# Patient Record
Sex: Male | Born: 1945
Health system: Southern US, Community
[De-identification: ages and names within clinical notes are randomized; demographics above are authoritative.]

## PROBLEM LIST (undated history)

## (undated) DIAGNOSIS — D649 Anemia, unspecified: Secondary | ICD-10-CM

## (undated) DIAGNOSIS — E785 Hyperlipidemia, unspecified: Secondary | ICD-10-CM

## (undated) DIAGNOSIS — N400 Enlarged prostate without lower urinary tract symptoms: Secondary | ICD-10-CM

## (undated) DIAGNOSIS — N529 Male erectile dysfunction, unspecified: Secondary | ICD-10-CM

## (undated) DIAGNOSIS — C61 Malignant neoplasm of prostate: Secondary | ICD-10-CM

## (undated) DIAGNOSIS — D693 Immune thrombocytopenic purpura: Secondary | ICD-10-CM

## (undated) DIAGNOSIS — I10 Essential (primary) hypertension: Secondary | ICD-10-CM

## (undated) DIAGNOSIS — N289 Disorder of kidney and ureter, unspecified: Secondary | ICD-10-CM

## (undated) DIAGNOSIS — N183 Chronic kidney disease, stage 3 unspecified: Secondary | ICD-10-CM

## (undated) DIAGNOSIS — M1A00X0 Idiopathic chronic gout, unspecified site, without tophus (tophi): Secondary | ICD-10-CM

## (undated) DIAGNOSIS — Z87438 Personal history of other diseases of male genital organs: Secondary | ICD-10-CM

## (undated) DIAGNOSIS — Z973 Presence of spectacles and contact lenses: Secondary | ICD-10-CM

## (undated) DIAGNOSIS — R739 Hyperglycemia, unspecified: Secondary | ICD-10-CM

## (undated) DIAGNOSIS — R339 Retention of urine, unspecified: Secondary | ICD-10-CM

## (undated) DIAGNOSIS — Z789 Other specified health status: Secondary | ICD-10-CM

## (undated) HISTORY — PX: PROSTATE BIOPSY: SHX241

## (undated) HISTORY — DX: Hyperlipidemia, unspecified: E78.5

## (undated) HISTORY — DX: Essential (primary) hypertension: I10

## (undated) HISTORY — PX: TONSILLECTOMY: SUR1361

## (undated) HISTORY — DX: Hyperglycemia, unspecified: R73.9

---

## 1997-08-26 ENCOUNTER — Emergency Department (HOSPITAL_COMMUNITY): Admission: EM | Admit: 1997-08-26 | Discharge: 1997-08-26 | Payer: Self-pay | Admitting: Emergency Medicine

## 2003-02-16 HISTORY — PX: COLONOSCOPY: SHX174

## 2013-12-03 ENCOUNTER — Ambulatory Visit (INDEPENDENT_AMBULATORY_CARE_PROVIDER_SITE_OTHER): Payer: Self-pay | Admitting: Internal Medicine

## 2013-12-03 VITALS — BP 132/78 | HR 86 | Temp 97.5°F | Resp 16 | Ht 63.5 in | Wt 157.0 lb

## 2013-12-03 DIAGNOSIS — Z0289 Encounter for other administrative examinations: Secondary | ICD-10-CM

## 2013-12-03 NOTE — Progress Notes (Signed)
   Subjective:    Patient ID: Steven Bell, male    DOB: 1946-02-10, 68 y.o.   MRN: [id number]  HPI Mr. 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 He states that he does not have health topics to discuss today, just here for a physical so that he can return to work today.  Taking BP meds, has BPH under eval by family doc.   Review of Systems  Constitutional: Negative.   HENT: Negative.   Eyes: Negative.   Respiratory: Negative.   Cardiovascular: Negative.   Gastrointestinal: Negative.   Endocrine: Negative.   Genitourinary: Positive for hematuria.  Musculoskeletal: Negative.   Skin: Negative.   Allergic/Immunologic: Negative.   Neurological: Negative.   Hematological: Negative.   Psychiatric/Behavioral: Negative.        Objective:   Physical Exam  Vitals reviewed. Constitutional: He is oriented to person, place, and time. He appears well-developed and well-nourished. No distress.  HENT:  Head: Normocephalic.  Right Ear: External ear normal.  Left Ear: External ear normal.  Nose: Nose normal.  Mouth/Throat: Oropharynx is clear and moist.  Eyes: Conjunctivae and EOM are normal. Pupils are equal, round, and reactive to light.  Neck: Normal range of motion. Neck supple. No tracheal deviation present. No thyromegaly present.  Cardiovascular: Normal rate, regular rhythm and intact distal pulses.   No murmur heard. Pulmonary/Chest: Effort normal and breath sounds normal.  Abdominal: Soft. Bowel sounds are normal. He exhibits no mass. There is no tenderness. A hernia is present. Hernia confirmed positive in the right inguinal area. Hernia confirmed negative in the left inguinal area.  Genitourinary: Testes normal and penis normal. Circumcised.  Musculoskeletal: Normal range of motion. He exhibits no edema and no tenderness.  Lymphadenopathy:    He has no cervical adenopathy.       Right: No inguinal adenopathy present.       Left: No inguinal adenopathy  present.  Neurological: He is alert and oriented to person, place, and time. He has normal reflexes. No cranial nerve deficit. He exhibits normal muscle tone. Coordination normal.  Psychiatric: He has a normal mood and affect. Judgment and thought content normal.          Assessment & Plan:  DOT 1year card/HTN controlled

## 2013-12-03 NOTE — Patient Instructions (Signed)

## 2013-12-26 ENCOUNTER — Encounter: Payer: Self-pay | Admitting: Internal Medicine

## 2014-01-25 DIAGNOSIS — E663 Overweight: Secondary | ICD-10-CM | POA: Diagnosis not present

## 2014-01-25 DIAGNOSIS — R972 Elevated prostate specific antigen [PSA]: Secondary | ICD-10-CM | POA: Diagnosis not present

## 2014-01-25 DIAGNOSIS — I10 Essential (primary) hypertension: Secondary | ICD-10-CM | POA: Diagnosis not present

## 2014-01-25 DIAGNOSIS — N400 Enlarged prostate without lower urinary tract symptoms: Secondary | ICD-10-CM | POA: Diagnosis not present

## 2014-01-25 DIAGNOSIS — R7301 Impaired fasting glucose: Secondary | ICD-10-CM | POA: Diagnosis not present

## 2014-01-25 DIAGNOSIS — M549 Dorsalgia, unspecified: Secondary | ICD-10-CM | POA: Diagnosis not present

## 2014-01-25 DIAGNOSIS — N183 Chronic kidney disease, stage 3 (moderate): Secondary | ICD-10-CM | POA: Diagnosis not present

## 2014-01-25 DIAGNOSIS — E785 Hyperlipidemia, unspecified: Secondary | ICD-10-CM | POA: Diagnosis not present

## 2014-02-04 DIAGNOSIS — N1330 Unspecified hydronephrosis: Secondary | ICD-10-CM | POA: Diagnosis not present

## 2014-02-04 DIAGNOSIS — I10 Essential (primary) hypertension: Secondary | ICD-10-CM | POA: Diagnosis not present

## 2014-02-07 ENCOUNTER — Encounter (HOSPITAL_COMMUNITY): Payer: Self-pay | Admitting: Emergency Medicine

## 2014-02-07 ENCOUNTER — Inpatient Hospital Stay (HOSPITAL_COMMUNITY)
Admission: EM | Admit: 2014-02-07 | Discharge: 2014-02-12 | DRG: 813 | Disposition: A | Discharge status: Home / Self Care | Payer: PRIVATE HEALTH INSURANCE | Attending: Internal Medicine | Admitting: Internal Medicine

## 2014-02-07 DIAGNOSIS — N289 Disorder of kidney and ureter, unspecified: Secondary | ICD-10-CM | POA: Diagnosis not present

## 2014-02-07 DIAGNOSIS — A419 Sepsis, unspecified organism: Secondary | ICD-10-CM | POA: Diagnosis not present

## 2014-02-07 DIAGNOSIS — R233 Spontaneous ecchymoses: Secondary | ICD-10-CM | POA: Diagnosis present

## 2014-02-07 DIAGNOSIS — R6510 Systemic inflammatory response syndrome (SIRS) of non-infectious origin without acute organ dysfunction: Secondary | ICD-10-CM | POA: Diagnosis not present

## 2014-02-07 DIAGNOSIS — M25531 Pain in right wrist: Secondary | ICD-10-CM | POA: Diagnosis not present

## 2014-02-07 DIAGNOSIS — I129 Hypertensive chronic kidney disease with stage 1 through stage 4 chronic kidney disease, or unspecified chronic kidney disease: Secondary | ICD-10-CM | POA: Diagnosis present

## 2014-02-07 DIAGNOSIS — N1330 Unspecified hydronephrosis: Secondary | ICD-10-CM | POA: Diagnosis present

## 2014-02-07 DIAGNOSIS — E785 Hyperlipidemia, unspecified: Secondary | ICD-10-CM | POA: Diagnosis present

## 2014-02-07 DIAGNOSIS — R319 Hematuria, unspecified: Secondary | ICD-10-CM | POA: Diagnosis present

## 2014-02-07 DIAGNOSIS — M25419 Effusion, unspecified shoulder: Secondary | ICD-10-CM | POA: Diagnosis not present

## 2014-02-07 DIAGNOSIS — N183 Chronic kidney disease, stage 3 (moderate): Secondary | ICD-10-CM | POA: Diagnosis present

## 2014-02-07 DIAGNOSIS — E871 Hypo-osmolality and hyponatremia: Secondary | ICD-10-CM | POA: Diagnosis present

## 2014-02-07 DIAGNOSIS — I10 Essential (primary) hypertension: Secondary | ICD-10-CM | POA: Diagnosis not present

## 2014-02-07 DIAGNOSIS — D693 Immune thrombocytopenic purpura: Principal | ICD-10-CM

## 2014-02-07 DIAGNOSIS — R238 Other skin changes: Secondary | ICD-10-CM | POA: Diagnosis not present

## 2014-02-07 DIAGNOSIS — D72829 Elevated white blood cell count, unspecified: Secondary | ICD-10-CM

## 2014-02-07 DIAGNOSIS — N400 Enlarged prostate without lower urinary tract symptoms: Secondary | ICD-10-CM | POA: Diagnosis present

## 2014-02-07 DIAGNOSIS — Z87440 Personal history of urinary (tract) infections: Secondary | ICD-10-CM

## 2014-02-07 DIAGNOSIS — D696 Thrombocytopenia, unspecified: Secondary | ICD-10-CM

## 2014-02-07 DIAGNOSIS — D649 Anemia, unspecified: Secondary | ICD-10-CM | POA: Diagnosis present

## 2014-02-07 DIAGNOSIS — M25431 Effusion, right wrist: Secondary | ICD-10-CM | POA: Diagnosis not present

## 2014-02-07 DIAGNOSIS — R6511 Systemic inflammatory response syndrome (SIRS) of non-infectious origin with acute organ dysfunction: Secondary | ICD-10-CM | POA: Diagnosis present

## 2014-02-07 DIAGNOSIS — R972 Elevated prostate specific antigen [PSA]: Secondary | ICD-10-CM | POA: Diagnosis not present

## 2014-02-07 DIAGNOSIS — N179 Acute kidney failure, unspecified: Secondary | ICD-10-CM | POA: Diagnosis not present

## 2014-02-07 DIAGNOSIS — M1990 Unspecified osteoarthritis, unspecified site: Secondary | ICD-10-CM | POA: Diagnosis not present

## 2014-02-07 DIAGNOSIS — R509 Fever, unspecified: Secondary | ICD-10-CM

## 2014-02-07 DIAGNOSIS — Z79899 Other long term (current) drug therapy: Secondary | ICD-10-CM | POA: Diagnosis not present

## 2014-02-07 DIAGNOSIS — M25511 Pain in right shoulder: Secondary | ICD-10-CM | POA: Diagnosis not present

## 2014-02-07 DIAGNOSIS — D631 Anemia in chronic kidney disease: Secondary | ICD-10-CM | POA: Diagnosis not present

## 2014-02-07 DIAGNOSIS — N3941 Urge incontinence: Secondary | ICD-10-CM | POA: Diagnosis present

## 2014-02-07 DIAGNOSIS — M25439 Effusion, unspecified wrist: Secondary | ICD-10-CM | POA: Diagnosis not present

## 2014-02-07 HISTORY — DX: Disorder of kidney and ureter, unspecified: N28.9

## 2014-02-07 HISTORY — DX: Benign prostatic hyperplasia without lower urinary tract symptoms: N40.0

## 2014-02-07 LAB — CBC WITH DIFFERENTIAL/PLATELET
Basophils Absolute: 0 10*3/uL (ref 0.0–0.1)
Basophils Relative: 0 % (ref 0–1)
Eosinophils Absolute: 0 10*3/uL (ref 0.0–0.7)
Eosinophils Relative: 0 % (ref 0–5)
HCT: 33.5 % — ABNORMAL LOW (ref 39.0–52.0)
Hemoglobin: 10.7 g/dL — ABNORMAL LOW (ref 13.0–17.0)
Lymphocytes Relative: 17 % (ref 12–46)
Lymphs Abs: 2.8 10*3/uL (ref 0.7–4.0)
MCH: 29.5 pg (ref 26.0–34.0)
MCHC: 31.9 g/dL (ref 30.0–36.0)
MCV: 92.3 fL (ref 78.0–100.0)
Monocytes Absolute: 1.2 10*3/uL — ABNORMAL HIGH (ref 0.1–1.0)
Monocytes Relative: 7 % (ref 3–12)
Neutro Abs: 12.9 10*3/uL — ABNORMAL HIGH (ref 1.7–7.7)
Neutrophils Relative %: 76 % (ref 43–77)
Platelets: <5 10*3/uL — CL (ref 150–400)
RBC: 3.63 MIL/uL — ABNORMAL LOW (ref 4.22–5.81)
RDW: 12.9 % (ref 11.5–15.5)
WBC: 16.9 10*3/uL — ABNORMAL HIGH (ref 4.0–10.5)

## 2014-02-07 LAB — COMPREHENSIVE METABOLIC PANEL
ALT: 14 U/L (ref 0–53)
AST: 23 U/L (ref 0–37)
Albumin: 4 g/dL (ref 3.5–5.2)
Alkaline Phosphatase: 62 U/L (ref 39–117)
Anion gap: 12 (ref 5–15)
BUN: 29 mg/dL — ABNORMAL HIGH (ref 6–23)
CO2: 22 mmol/L (ref 19–32)
Calcium: 9.6 mg/dL (ref 8.4–10.5)
Chloride: 105 mEq/L (ref 96–112)
Creatinine, Ser: 1.69 mg/dL — ABNORMAL HIGH (ref 0.50–1.35)
GFR calc Af Amer: 46 mL/min — ABNORMAL LOW (ref >90)
GFR calc non Af Amer: 40 mL/min — ABNORMAL LOW (ref >90)
Glucose, Bld: 115 mg/dL — ABNORMAL HIGH (ref 70–99)
Potassium: 4.2 mmol/L (ref 3.5–5.1)
Sodium: 139 mmol/L (ref 135–145)
Total Bilirubin: 0.9 mg/dL (ref 0.3–1.2)
Total Protein: 9 g/dL — ABNORMAL HIGH (ref 6.0–8.3)

## 2014-02-07 LAB — PROTIME-INR
INR: 1.04 (ref 0.00–1.49)
Prothrombin Time: 13.7 seconds (ref 11.6–15.2)

## 2014-02-07 LAB — RETICULOCYTES
RBC.: 3.63 MIL/uL — ABNORMAL LOW (ref 4.22–5.81)
Retic Count, Absolute: 32.7 10*3/uL (ref 19.0–186.0)
Retic Ct Pct: 0.9 % (ref 0.4–3.1)

## 2014-02-07 LAB — RAPID HIV SCREEN (WH-MAU): Rapid HIV Screen: NONREACTIVE

## 2014-02-07 LAB — TYPE AND SCREEN
ABO/RH(D): O POS
Antibody Screen: NEGATIVE

## 2014-02-07 LAB — SAVE SMEAR

## 2014-02-07 LAB — APTT: aPTT: 29 seconds (ref 24–37)

## 2014-02-07 LAB — ABO/RH: ABO/RH(D): O POS

## 2014-02-07 MED ORDER — PIPERACILLIN-TAZOBACTAM 3.375 G IVPB 30 MIN
3.3750 g | Freq: Once | INTRAVENOUS | Status: AC
  Administered 2014-02-07: 3.375 g via INTRAVENOUS
  Filled 2014-02-07: qty 50

## 2014-02-07 MED ORDER — ACETAMINOPHEN 325 MG PO TABS
650.0000 mg | ORAL_TABLET | Freq: Once | ORAL | Status: AC
Start: 2014-02-07 — End: 2014-02-07
  Administered 2014-02-07: 650 mg via ORAL
  Filled 2014-02-07: qty 2

## 2014-02-07 MED ORDER — LISINOPRIL 20 MG PO TABS
20.0000 mg | ORAL_TABLET | Freq: Every day | ORAL | Status: DC
  Administered 2014-02-08 – 2014-02-09 (×2): 20 mg via ORAL
  Filled 2014-02-07 (×2): qty 1

## 2014-02-07 MED ORDER — METHYLPREDNISOLONE SODIUM SUCC 125 MG IJ SOLR
70.0000 mg | Freq: Two times a day (BID) | INTRAMUSCULAR | Status: DC
  Administered 2014-02-08 – 2014-02-09 (×4): 70 mg via INTRAVENOUS
  Filled 2014-02-07 (×6): qty 1.12

## 2014-02-07 MED ORDER — MORPHINE SULFATE 4 MG/ML IJ SOLN
6.0000 mg | Freq: Once | INTRAMUSCULAR | Status: AC
  Administered 2014-02-07: 6 mg via INTRAVENOUS
  Filled 2014-02-07: qty 2

## 2014-02-07 MED ORDER — ADULT MULTIVITAMIN W/MINERALS CH
1.0000 | ORAL_TABLET | Freq: Every day | ORAL | Status: DC
  Administered 2014-02-08 – 2014-02-12 (×5): 1 via ORAL
  Filled 2014-02-07 (×6): qty 1

## 2014-02-07 MED ORDER — ACETAMINOPHEN 500 MG PO TABS
500.0000 mg | ORAL_TABLET | Freq: Four times a day (QID) | ORAL | Status: DC | PRN
  Filled 2014-02-07: qty 2

## 2014-02-07 MED ORDER — METHYLPREDNISOLONE SODIUM SUCC 125 MG IJ SOLR
1.0000 mg/kg | Freq: Once | INTRAMUSCULAR | Status: AC
  Administered 2014-02-07: 71.25 mg via INTRAVENOUS
  Filled 2014-02-07: qty 1.14

## 2014-02-07 MED ORDER — ATORVASTATIN CALCIUM 10 MG PO TABS
10.0000 mg | ORAL_TABLET | Freq: Every day | ORAL | Status: DC
  Administered 2014-02-08 – 2014-02-12 (×5): 10 mg via ORAL
  Filled 2014-02-07 (×5): qty 1

## 2014-02-07 MED ORDER — IMMUNE GLOBULIN (HUMAN) 10 GM/100ML IV SOLN
1.0000 g/kg | INTRAVENOUS | Status: DC
  Administered 2014-02-08: 70 g via INTRAVENOUS
  Filled 2014-02-07 (×2): qty 700

## 2014-02-07 MED ORDER — SODIUM CHLORIDE 0.9 % IJ SOLN
3.0000 mL | Freq: Two times a day (BID) | INTRAMUSCULAR | Status: DC
  Administered 2014-02-07 – 2014-02-11 (×7): 3 mL via INTRAVENOUS

## 2014-02-07 MED ORDER — VANCOMYCIN HCL IN DEXTROSE 750-5 MG/150ML-% IV SOLN
750.0000 mg | Freq: Two times a day (BID) | INTRAVENOUS | Status: DC
  Administered 2014-02-07: 750 mg via INTRAVENOUS
  Filled 2014-02-07 (×2): qty 150

## 2014-02-07 MED ORDER — METOPROLOL SUCCINATE ER 100 MG PO TB24
100.0000 mg | ORAL_TABLET | Freq: Every day | ORAL | Status: DC
  Administered 2014-02-08 – 2014-02-12 (×5): 100 mg via ORAL
  Filled 2014-02-07 (×5): qty 1

## 2014-02-07 MED ORDER — SODIUM CHLORIDE 0.9 % IV SOLN
10.0000 mL/h | Freq: Once | INTRAVENOUS | Status: AC
Start: 2014-02-07 — End: 2014-02-07
  Administered 2014-02-07: 10 mL/h via INTRAVENOUS

## 2014-02-07 MED ORDER — TAMSULOSIN HCL 0.4 MG PO CAPS
0.4000 mg | ORAL_CAPSULE | Freq: Every day | ORAL | Status: DC
  Administered 2014-02-07 – 2014-02-11 (×5): 0.4 mg via ORAL
  Filled 2014-02-07 (×6): qty 1

## 2014-02-07 MED ORDER — METHYLPREDNISOLONE SODIUM SUCC 125 MG IJ SOLR: 1.0000 mg/kg | Freq: Two times a day (BID) | INTRAMUSCULAR | Status: DC

## 2014-02-07 MED ORDER — VANCOMYCIN HCL IN DEXTROSE 1-5 GM/200ML-% IV SOLN
1000.0000 mg | Freq: Once | INTRAVENOUS | Status: DC
  Filled 2014-02-07: qty 200

## 2014-02-07 MED ORDER — PIPERACILLIN-TAZOBACTAM 3.375 G IVPB
3.3750 g | Freq: Three times a day (TID) | INTRAVENOUS | Status: DC
  Administered 2014-02-08 – 2014-02-12 (×13): 3.375 g via INTRAVENOUS
  Filled 2014-02-07 (×15): qty 50

## 2014-02-07 MED ORDER — HYDROCHLOROTHIAZIDE 25 MG PO TABS
25.0000 mg | ORAL_TABLET | Freq: Every day | ORAL | Status: DC
  Administered 2014-02-08 – 2014-02-09 (×2): 25 mg via ORAL
  Filled 2014-02-07 (×2): qty 1

## 2014-02-07 NOTE — Progress Notes (Signed)
New Hematology/Oncology Consult   Referral MD: Dr. Alcario Drought        Reason for Referral: Severe thrombocytopenia     HPI: He reports a 2 day history of right wrist and right shoulder pain. The pain was not relieved with Tylenol and ibuprofen. He presented to [facility] urgent care (we do not have the note or laboratory studies available tonight) and was found to have severe thrombocytopenia. He was referred to the emergency room for further evaluation.  He noted bruising in the mouth beginning yesterday. No fever, recent infection, or new medications. He otherwise feels well. No pain in other sites.  He reports he is being evaluated by his primary physician for an elevated PSA and abnormal renal function.    Past Medical History  Diagnosis Date  . Hypertension   . Hyperlipidemia   . BPH (benign prostatic hyperplasia)   .    Past surgical history: None    family history: No family history of a hematologic condition or cancer   Social history: He is a Administrator. He does not smoke. No heavy alcohol use. No transfusion history. No risk factor for HIV or hepatitis.   Current facility-administered medications: acetaminophen (TYLENOL) tablet 500-1,000 mg, 500-1,000 mg, Oral, Q6H PRN, Etta Quill, DO;  [START ON 02/08/2014] atorvastatin (LIPITOR) tablet 10 mg, 10 mg, Oral, Daily, Jared M Gardner, DO;  [START ON 02/08/2014] hydrochlorothiazide (HYDRODIURIL) tablet 25 mg, 25 mg, Oral, Daily, Jared M Gardner, DO [START ON 02/08/2014] lisinopril (PRINIVIL,ZESTRIL) tablet 20 mg, 20 mg, Oral, Daily, Jared M Gardner, DO;  [START ON 02/08/2014] metoprolol succinate (TOPROL-XL) 24 hr tablet 100 mg, 100 mg, Oral, Daily, Jared M Gardner, DO;  multivitamin with minerals tablet 1 tablet, 1 tablet, Oral, Daily, Jared M Gardner, DO;  [START ON 02/08/2014] piperacillin-tazobactam (ZOSYN) IVPB 3.375 g, 3.375 g, Intravenous, Q8H, Angela Adam, RPH sodium chloride 0.9 % injection 3 mL, 3 mL, Intravenous,  Q12H, Jared M Gardner, DO;  tamsulosin (FLOMAX) capsule 0.4 mg, 0.4 mg, Oral, QHS, Jared M Gardner, DO;  vancomycin (VANCOCIN) IVPB 750 mg/150 ml premix, 750 mg, Intravenous, Q12H, Angela Adam, [facility] Current outpatient prescriptions: acetaminophen (TYLENOL) 500 MG tablet, Take 500-1,000 mg by mouth every 6 (six) hours as needed for moderate pain., Disp: , Rfl: ;  atorvastatin (LIPITOR) 10 MG tablet, Take 10 mg by mouth daily., Disp: , Rfl: ;  hydrochlorothiazide (HYDRODIURIL) 25 MG tablet, Take 25 mg by mouth daily., Disp: , Rfl: ;  lisinopril (PRINIVIL,ZESTRIL) 20 MG tablet, Take 20 mg by mouth daily., Disp: , Rfl:  metoprolol succinate (TOPROL-XL) 100 MG 24 hr tablet, Take 100 mg by mouth daily. Take with or immediately following a meal., Disp: , Rfl: ;  Multiple Vitamin (MULTIVITAMIN WITH MINERALS) TABS tablet, Take 1 tablet by mouth daily., Disp: , Rfl: ;  tamsulosin (FLOMAX) 0.4 MG CAPS capsule, Take 0.4 mg by mouth at bedtime., Disp: , Rfl: ;  aspirin EC 81 MG tablet, Take 81 mg by mouth at bedtime., Disp: , Rfl: :   Review of Systems:  Positives include: Pain and swelling at the right wrist and right shoulder for the past 2 days, bruises at the uncle mucosa for the past 1 day, he reports several episodes of gross hematuria-none in the past week, polyuria/nocturia  A complete ROS was otherwise negative.   Physical Exam:  Blood pressure 151/66, pulse 90, temperature 101.1 F (38.4 C), temperature source Rectal, resp. rate 26, SpO2 95 %.  HEENT: Ecchymoses at the  buccal mucosa bilaterally, no thrush, oral cavity without visible mass Lungs: Clear bilaterally Cardiac: Regular rate and rhythm Abdomen: Soft and nontender, no hepatosplenomegaly GU: Testes without mass  Vascular: No leg edema Lymph nodes: No cervical, supraclavicular, or inguinal nodes. "Shotty "bilateral axillary nodes Neurologic: Alert and oriented, the motor exam appears grossly intact Skin: Petechial rash over the  legs Musculoskeletal: Soft tissue swelling superior and posterior to the right shoulder joint with an ecchymosis posteriorly, soft tissue swelling at the right wrist and distal forearm. No pain with motion at the wrist or right shoulder.  LABS:   Recent Labs  02/07/14 1834  WBC 16.9*  HGB 10.7*  HCT 33.5*  PLT <5*   Peripheral blood smear: Rare large platelet form noted on extensive review of the blood smear. No platelet clumps. The majority of the white cells are mature neutrophils. Several 5 lobed neutrophils. No blasts. A few atypical lymphocytes, but no monotonous white cell population. The red cell morphology is generally unremarkable. The polychromasia is not increased. No schistocytes. I saw one [employer] body, no other nucleated red cells.    Recent Labs  02/07/14 1708  NA 139  K 4.2  CL 105  CO2 22  GLUCOSE 115*  BUN 29*  CREATININE 1.69*  CALCIUM 9.6   AST 23, ALT 14, bilirubin 0.9     Assessment and Plan:   1. Severe thrombocytopenia 2. Leukocytosis 3. Mild anemia 4. Fever 5. Soft tissue swelling at the right wrist and right shoulder-most likely soft tissue hematomas versus hemarthroses versus infection 6. History of gross hematuria 7. Polyuria/nocturia 8. Renal insufficiency-chronicity? 9. History of hypertension  Mr. Nordling presents with severe thrombocytopenia. He has mucosal/skin bleeding. He has hematomas versus joint swelling at the right wrist and right shoulder.  He most likely has ITP. There is no apparent associated condition such as a hematologic malignancy, collagen vascular disease, or systemic infection. He does not appear to have TTP. The anemia may be related to bleeding and the leukocytosis may be secondary to an infection.  He is febrile. This could be related to bleeding, but he should be evaluated for a systemic infection.  Recommendations: 1. Start steroids and IVIG 2. Transfuse platelets for progressive soft tissue bleeding 3.  Check blood and urine cultures and began broad-spectrum antibiotics 4. Check PSA,,Ldh, and uric acid 5. Daily CBC 6. Follow-up results of recent renal ultrasound 7. Hematology will follow him daily   [name], MD 02/07/2014, 9:50 PM

## 2014-02-07 NOTE — ED Notes (Addendum)
Critical value of platelets < than 5 amf a HIV is non reactive.  Values communicated to MD KOHUT and RN Alroy Dust

## 2014-02-07 NOTE — Progress Notes (Signed)
ANTIBIOTIC CONSULT NOTE - INITIAL  Pharmacy Consult for Vanc/Zosyn Indication:Sepsis  No Known Allergies  Patient Measurements:    Body Weight: 71.2kg  Vital Signs: Temp: 101.1 F (38.4 C) (12/24 1938) Temp Source: Rectal (12/24 1938) BP: 151/66 mmHg (12/24 2045) Pulse Rate: 90 (12/24 2045) Intake/Output from previous day:   Intake/Output from this shift:    Labs:  Recent Labs  02/07/14 1708 02/07/14 1834  WBC  --  16.9*  HGB  --  10.7*  PLT  --  <5*  CREATININE 1.69*  --    CrCl cannot be calculated (Unknown ideal weight.). No results for input(s): VANCOTROUGH, VANCOPEAK, VANCORANDOM, GENTTROUGH, GENTPEAK, GENTRANDOM, TOBRATROUGH, TOBRAPEAK, TOBRARND, AMIKACINPEAK, AMIKACINTROU, AMIKACIN in the last 72 hours.   Microbiology: No results found for this or any previous visit (from the past 720 hour(s)).  Medical History: Past Medical History  Diagnosis Date  . Hypertension   . Hyperlipidemia     Assessment: 68yM sent from 69 office for evaluation of thrombocytopenia. Lab work from today with platelets resulted as 0. Also leukocytosis in 20s. Pt initially presented to office for evaluation of atraumatic R shoulder and R wrist pain/swelling.  Pharmacy consulted to dose Vancomycin and zosyn for sepsis  Goal of Therapy:  Vancomycin trough level 15-20 mcg/ml Vanc and Zosyn per renal function  Plan:   Zosyn 3.375mg  IV q8h EI  Vancomycin 750mg  IV q12h  Follow renal function/cultures/clinical course  Vancomycin trough as needed  Dolly Rias RPh 02/07/2014, 9:26 PM Pager [phone]

## 2014-02-07 NOTE — ED Notes (Signed)
Per pt, states had blood work done at PCP and was told he has low platelets-took xrays of right shoulder and hand due to increased pain and swelling

## 2014-02-07 NOTE — ED Provider Notes (Signed)
CSN: [id number]     Arrival date & time 02/07/14  1513 History   First MD Initiated Contact with Patient 02/07/14 1600     Chief Complaint  Patient presents with  . Low platelet count       (Consider location/radiation/quality/duration/timing/severity/associated sxs/prior Treatment) HPI   68yM sent from physician's office for evaluation of thrombocytopenia. Lab work from today with platelets resulted as 0. Also leukocytosis in 20s. Pt initially presented to office for evaluation of atraumatic R shoulder and R wrist pain/swelling. First noticed this about 3 days ago. Constant/progressive since. Today noticed petechiae to b/l ankles and wrists. No blood in stool or melena. No epistaxis/gingival bleeding. Denies recent febrile illness. No recent medication changes. No known history of of malignancy or thrombocytopenia. Denies HA. No neurological complaints. Significant other at bedside reports baseline mental status. No recent GI complaints.   Past Medical History  Diagnosis Date  . Hypertension   . Hyperlipidemia    History reviewed. No pertinent past surgical history. No family history on file. History  Substance Use Topics  . Smoking status: Never Smoker   . Smokeless tobacco: Not on file  . Alcohol Use: No    Review of Systems  All systems reviewed and negative, other than as noted in HPI.   Allergies  Review of patient's allergies indicates no known allergies.  Home Medications   Prior to Admission medications   Medication Sig Start Date End Date Taking? Authorizing Provider  acetaminophen (TYLENOL) 500 MG tablet Take 500-1,000 mg by mouth every 6 (six) hours as needed for moderate pain.   Yes Historical Provider, MD  aspirin EC 81 MG tablet Take 81 mg by mouth at bedtime.   Yes Historical Provider, MD  lisinopril-hydrochlorothiazide (PRINZIDE,ZESTORETIC) 20-25 MG per tablet Take 1 tablet by mouth daily.   Yes Historical Provider, MD  metoprolol succinate (TOPROL-XL) 100  MG 24 hr tablet Take 100 mg by mouth daily. Take with or immediately following a meal.   Yes Historical Provider, MD  Multiple Vitamin (MULTIVITAMIN WITH MINERALS) TABS tablet Take 1 tablet by mouth daily.   Yes Historical Provider, MD  tamsulosin (FLOMAX) 0.4 MG CAPS capsule Take 0.4 mg by mouth at bedtime.   Yes Historical Provider, MD   BP 132/75 mmHg  Pulse 82  Temp(Src) 98.4 F (36.9 C) (Oral)  Resp 18  SpO2 100% Physical Exam  Constitutional: He is oriented to person, place, and time. He appears well-developed and well-nourished. No distress.  HENT:  Head: Normocephalic and atraumatic.  Eyes: Conjunctivae are normal. Pupils are equal, round, and reactive to light. Right eye exhibits no discharge. Left eye exhibits no discharge.  Neck: Neck supple.  Cardiovascular: Normal rate, regular rhythm and normal heart sounds.  Exam reveals no gallop and no friction rub.   No murmur heard. Pulmonary/Chest: Effort normal and breath sounds normal. No respiratory distress.  Abdominal: Soft. He exhibits no distension. There is no tenderness.  Musculoskeletal:  Swelling, tenderness to R wrist. Able to actively flex/extend wrist although with increased pain. Patchy ecchymosis to R shoulder. No appreciable swelling. Pain with both passive and active ROM. NVI distally.   Neurological: He is alert and oriented to person, place, and time. No cranial nerve deficit. He exhibits normal muscle tone. Coordination normal.  Skin: Skin is warm and dry. He is not diaphoretic.  Petechiae to distal shins and dorsal aspects of distal wrists.   Psychiatric: He has a normal mood and affect. His behavior is normal. Thought content  normal.  Nursing note and vitals reviewed.   ED Course  Procedures (including critical care time)  CRITICAL CARE Performed by: Virgel Manifold   Total critical care time: 35 minutes  Critical care time was exclusive of separately billable procedures and treating other  patients. Critical care was necessary to treat or prevent imminent or life-threatening deterioration. Critical care was time spent personally by me on the following activities: development of treatment plan with patient and/or surrogate as well as nursing, discussions with consultants, evaluation of patient's response to treatment, examination of patient, obtaining history from patient or surrogate, ordering and performing treatments and interventions, ordering and review of laboratory studies, ordering and review of radiographic studies, pulse oximetry and re-evaluation of patient's condition.   Labs Review Labs Reviewed  COMPREHENSIVE METABOLIC PANEL - Abnormal; Notable for the following:    Glucose, Bld 115 (*)    BUN 29 (*)    Creatinine, Ser 1.69 (*)    Total Protein 9.0 (*)    GFR calc non Af Amer 40 (*)    GFR calc Af Amer 46 (*)    All other components within normal limits  CBC WITH DIFFERENTIAL - Abnormal; Notable for the following:    WBC 16.9 (*)    RBC 3.63 (*)    Hemoglobin 10.7 (*)    HCT 33.5 (*)    Platelets <5 (*)    Neutro Abs 12.9 (*)    Monocytes Absolute 1.2 (*)    All other components within normal limits  RETICULOCYTES - Abnormal; Notable for the following:    RBC. 3.63 (*)    All other components within normal limits  CULTURE, BLOOD (ROUTINE X 2)  CULTURE, BLOOD (ROUTINE X 2)  APTT  PROTIME-INR  RAPID HIV SCREEN ([facility])  SAVE SMEAR  CBC WITH DIFFERENTIAL  PATHOLOGIST SMEAR REVIEW  HCV RNA QUANT  URINALYSIS, ROUTINE W REFLEX MICROSCOPIC  TYPE AND SCREEN  ABO/RH    Imaging Review No results found.   EKG Interpretation None      MDM   Final diagnoses:  ITP (idiopathic thrombocytopenic purpura)  SIRS (systemic inflammatory response syndrome)  Renal impairment    68yM with leukocytosis/thrombocytopenia. Leukemia? No prior history of thrombocytopenia. No obvious possible offenders per my review of med list. Denies recent med changes.  Afebrile. Denies recent febrile illness. May be worth checking HIV/Hepatitis.  Unclear etiology of shoulder and wrist pain. Spontaneous hemarthrosis? Would be interesting to have this as manifestation and not more commonly noted bleeding. Joint pain from leukemic blast infiltration? Septic joint? Crystal arthropathy? Will repeat CBC to verify. Peripheral smear. Clinically no signs of life threatening bleeding at this time. Type screen. Renal function/BMP. Will discuss with heme/onc.   Oral temp noted to be 100.0 on recheck. Obtained rectal temp and 101.1.   Platelets confirmed < 5,000. Leukocytosis of 16.9 with elevated ANC. Differential otherwise pretty unremarkable.  Also, renal impairment of uncertain chronicity. TTP? HUS?  ITP with or from concurrent infectious process?  Will initiate abx. Cannot r/o septic joint. Would not attempt aspiration with platelet count at this level. Needs smear reviewed.    Discussed with Dr Benay Spice, oncology. Will review smear and evaluate pt. Appreciate input. Admission to medical service.   Virgel Manifold, MD 02/14/14 [phone]

## 2014-02-07 NOTE — H&P (Signed)
[facility] History and Physical  Steven Bell DOB: 08-16-1945 DOA: 02/07/2014  Referring physician: EDP PCP: [name], MD   Chief Complaint: Low platelets   HPI: Steven Bell is a 68 y.o. male who presented to his PCPs office today after he developed atraumatic, painful, right wrist and right shoulder swelling onset 3 days ago.  Symptoms were constant and progressive since then.  Today he noticed petechiae to B ankles and wrists.  No blood in stool or melena.  Today finally developed gingival bleeding and so presented to his PCPs office.  At PCPs office, labs drawn which would come back with critically low platelets of 0.  He was called and told to come to ED.  Review of Systems: Systems reviewed.  As above, otherwise negative  Past Medical History  Diagnosis Date  . Hypertension   . Hyperlipidemia    History reviewed. No pertinent past surgical history. Social History:  reports that he has never smoked. He does not have any smokeless tobacco history on file. He reports that he does not drink alcohol or use illicit drugs.  No Known Allergies  No family history on file.   Prior to Admission medications   Medication Sig Start Date End Date Taking? Authorizing Provider  acetaminophen (TYLENOL) 500 MG tablet Take 500-1,000 mg by mouth every 6 (six) hours as needed for moderate pain.   Yes Historical Provider, MD  atorvastatin (LIPITOR) 10 MG tablet Take 10 mg by mouth daily.   Yes Historical Provider, MD  hydrochlorothiazide (HYDRODIURIL) 25 MG tablet Take 25 mg by mouth daily.   Yes Historical Provider, MD  lisinopril (PRINIVIL,ZESTRIL) 20 MG tablet Take 20 mg by mouth daily.   Yes Historical Provider, MD  metoprolol succinate (TOPROL-XL) 100 MG 24 hr tablet Take 100 mg by mouth daily. Take with or immediately following a meal.   Yes Historical Provider, MD  Multiple Vitamin (MULTIVITAMIN WITH MINERALS) TABS tablet Take 1 tablet by mouth daily.   Yes  Historical Provider, MD  tamsulosin (FLOMAX) 0.4 MG CAPS capsule Take 0.4 mg by mouth at bedtime.   Yes Historical Provider, MD  aspirin EC 81 MG tablet Take 81 mg by mouth at bedtime.    Historical Provider, MD   Physical Exam: Filed Vitals:   02/07/14 2045  BP: 151/66  Pulse: 90  Temp:   Resp: 26    BP 151/66 mmHg  Pulse 90  Temp(Src) 101.1 F (38.4 C) (Rectal)  Resp 26  SpO2 95%  General Appearance:    Alert, oriented, no distress, appears stated age  Head:    Normocephalic, atraumatic  Eyes:    PERRL, EOMI, sclera non-icteric        Nose:   Nares without drainage or epistaxis. Mucosa, turbinates normal  Throat:   Moist mucous membranes. Oropharynx without erythema or exudate.  dryed blood in mouth.  Neck:   Supple. No carotid bruits.  No thyromegaly.  No lymphadenopathy.   Back:     No CVA tenderness, no spinal tenderness  Lungs:     Clear to auscultation bilaterally, without wheezes, rhonchi or rales  Chest wall:    No tenderness to palpitation  Heart:    Regular rate and rhythm without murmurs, gallops, rubs  Abdomen:     Soft, non-tender, nondistended, normal bowel sounds, no organomegaly  Genitalia:    deferred  Rectal:    deferred  Extremities:   No clubbing, cyanosis or edema.  Pulses:   2+ and symmetric all  extremities  Skin:   petichial rash over BLE, bruising over R wrist and R shoulder, does not seem to be in the joint but rather overlies the joint.  Lymph nodes:   Cervical, supraclavicular, and axillary nodes normal  Neurologic:   CNII-XII intact. Normal strength, sensation and reflexes      throughout    Labs on Admission:  Basic Metabolic Panel:  Recent Labs Lab 02/07/14 1708  NA 139  K 4.2  CL 105  CO2 22  GLUCOSE 115*  BUN 29*  CREATININE 1.69*  CALCIUM 9.6   Liver Function Tests:  Recent Labs Lab 02/07/14 1708  AST 23  ALT 14  ALKPHOS 62  BILITOT 0.9  PROT 9.0*  ALBUMIN 4.0   No results for input(s): LIPASE, AMYLASE in the last  168 hours. No results for input(s): AMMONIA in the last 168 hours. CBC:  Recent Labs Lab 02/07/14 1834  WBC 16.9*  NEUTROABS 12.9*  HGB 10.7*  HCT 33.5*  MCV 92.3  PLT <5*   Cardiac Enzymes: No results for input(s): CKTOTAL, CKMB, CKMBINDEX, TROPONINI in the last 168 hours.  BNP (last 3 results) No results for input(s): PROBNP in the last 8760 hours. CBG: No results for input(s): GLUCAP in the last 168 hours.  Radiological Exams on Admission: No results found.  EKG: Independently reviewed.  Assessment/Plan Principal Problem:   Acute ITP Active Problems:   SIRS (systemic inflammatory response syndrome)   Renal impairment   Swelling of joint of right wrist   1. Acute ITP - Dr. Benay Spice has reivewed the patients smear: 0 platelets, does have increased segmented neutrophils but no other abnormal WBC seen on smear (does not appear to be leukemia, lymphoma, etc).  No Schistocytes.  Dr. Benay Spice believes that this patients presentation represents ITP. 1. Steriods and IVIG per Dr. Benay Spice, he says he will write for these. 2. Repeat CBC in AM 3. No platelet transfusion for now per Dr. Benay Spice unless patient starts actively bleeding. 4. DVT ppx only with SCDs 2. SIRS - patient has fever and leukocytosis: 1. Wrist joint is possible source, vs just an overlying hematoma, X ray of wrist at PCPs office was reportedly negative, cannot tap joint with platelet count of 0 2. Doubt Shoulder joint is infected, seems to just have overlying bruise. 3. Uric acid pending (patient thinks he probably has had gout in the past in his feet but no formal diagnosis) with am labs. 4. Cultures pending 5. UA pending 6. Empiric zosyn and vanc 3. Renal impairment - believed to be chronic, unknown baseline creatinine but patient has been having known "kidney problems" on labs "for a while" which prompted renal ultrasound to be performed last Sunday.  Thus I doubt that this represents an acute  change. 1. Repeat BMP in AM  Dr. Benay Spice has physically seen the patient with me in the room here in ED.  See above.  Code Status: Full Code  Family Communication: Wife at bedside Disposition Plan: Admit to inpatient   Time spent: 57 min  Steven Bell M. [facility] Pager [phone]  If 7AM-7PM, please contact the day team taking care of the patient [email or web address] Password [facility] 02/07/2014, 9:21 PM

## 2014-02-08 DIAGNOSIS — M25419 Effusion, unspecified shoulder: Secondary | ICD-10-CM

## 2014-02-08 DIAGNOSIS — D693 Immune thrombocytopenic purpura: Principal | ICD-10-CM

## 2014-02-08 DIAGNOSIS — M25439 Effusion, unspecified wrist: Secondary | ICD-10-CM

## 2014-02-08 LAB — URINALYSIS, ROUTINE W REFLEX MICROSCOPIC
Bilirubin Urine: NEGATIVE
Glucose, UA: >1000 mg/dL — AB
Ketones, ur: NEGATIVE mg/dL
Nitrite: NEGATIVE
Protein, ur: 30 mg/dL — AB
Specific Gravity, Urine: 1.021 (ref 1.005–1.030)
Urobilinogen, UA: 0.2 mg/dL (ref 0.0–1.0)
pH: 5.5 (ref 5.0–8.0)

## 2014-02-08 LAB — CBC
HEMATOCRIT: 32.3 % — AB (ref 39.0–52.0)
Hemoglobin: 10.6 g/dL — ABNORMAL LOW (ref 13.0–17.0)
MCH: 30 pg (ref 26.0–34.0)
MCHC: 32.8 g/dL (ref 30.0–36.0)
MCV: 91.5 fL (ref 78.0–100.0)
Platelets: <5 10*3/uL — CL (ref 150–400)
RBC: 3.53 MIL/uL — ABNORMAL LOW (ref 4.22–5.81)
RDW: 13 % (ref 11.5–15.5)
WBC: 17.2 10*3/uL — ABNORMAL HIGH (ref 4.0–10.5)

## 2014-02-08 LAB — BASIC METABOLIC PANEL
Anion gap: 8 (ref 5–15)
BUN: 29 mg/dL — AB (ref 6–23)
CALCIUM: 8.4 mg/dL (ref 8.4–10.5)
CO2: 20 mmol/L (ref 19–32)
Chloride: 100 mEq/L (ref 96–112)
Creatinine, Ser: 1.81 mg/dL — ABNORMAL HIGH (ref 0.50–1.35)
GFR calc Af Amer: 42 mL/min — ABNORMAL LOW (ref >90)
GFR, EST NON AFRICAN AMERICAN: 36 mL/min — AB (ref >90)
Glucose, Bld: 195 mg/dL — ABNORMAL HIGH (ref 70–99)
Potassium: 4 mmol/L (ref 3.5–5.1)
Sodium: 128 mmol/L — ABNORMAL LOW (ref 135–145)

## 2014-02-08 LAB — PSA: PSA: 5.96 ng/mL — ABNORMAL HIGH (ref <=4.00)

## 2014-02-08 LAB — URINE MICROSCOPIC-ADD ON

## 2014-02-08 LAB — LACTATE DEHYDROGENASE: LDH: 106 U/L (ref 94–250)

## 2014-02-08 LAB — URIC ACID: Uric Acid, Serum: 8.3 mg/dL — ABNORMAL HIGH (ref 4.0–7.8)

## 2014-02-08 MED ORDER — VANCOMYCIN HCL IN DEXTROSE 750-5 MG/150ML-% IV SOLN
750.0000 mg | INTRAVENOUS | Status: DC
  Administered 2014-02-08 – 2014-02-12 (×4): 750 mg via INTRAVENOUS
  Filled 2014-02-08 (×4): qty 150

## 2014-02-08 MED ORDER — IMMUNE GLOBULIN (HUMAN) 10 GM/100ML IV SOLN
1.0000 g/kg | INTRAVENOUS | Status: AC
  Administered 2014-02-08: 70 g via INTRAVENOUS
  Filled 2014-02-08: qty 700

## 2014-02-08 NOTE — Progress Notes (Signed)
SUBJECTIVE: Shoulder bruise is better, Right wrist bruise stable. Denies any nose or gum bleeds.. Low grade temp on Zosyn  OBJECTIVE PHYSICAL EXAMINATION:  Filed Vitals:   02/08/14 0535  BP: 113/62  Pulse: 96  Temp: 99.1 F (37.3 C)  Resp: 18   Filed Weights   02/07/14 2145 02/07/14 2225  Weight: 156 lb 15.5 oz (71.2 kg) 153 lb 8 oz (69.627 kg)    GENERAL:alert, no distress  SKIN: Bruise shoulder better OROPHARYNX:no exudate, no erythema and lips, buccal mucosa, and tongue normal  LUNGS: clear to auscultation and percussion with normal breathing effort HEART: regular rate & rhythm and no murmurs and no lower extremity edema ABDOMEN:abdomen soft, non-tender and normal bowel sounds Musculoskeletal:no cyanosis of digits and no clubbing  EXT: Right wrist soft tissue swelling with warmth and tenderness NEURO: alert & oriented x 3 with fluent speech, no focal motor/sensory deficits  LABORATORY DATA:  I have reviewed the data as listed @LASTCHEMISTRY @  Lab Results  Component Value Date   WBC 17.2* 02/08/2014   HGB 10.6* 02/08/2014   HCT 32.3* 02/08/2014   MCV 91.5 02/08/2014   PLT <5* 02/08/2014   NEUTROABS 12.9* 02/07/2014    ASSESSMENT AND PLAN: 1. ITP: on IVIG X 2 days and Methyl prednisone IV. Since no active bleeding, hold off on platelet transfusion. Monitor CBCD daily 2. Once his platelets recover (>50), we can switch him to oral prednisone. 3. Joint swelling with fever: on Zosyn and Vanc (most likely the underlying reason for ITP) Continue current therapy Will follow

## 2014-02-08 NOTE — Progress Notes (Signed)
ANTIBIOTIC CONSULT NOTE - Follow Up  Pharmacy Consult for vancomycin, Zosyn Indication: SIRS, r/o sepsis  No Known Allergies  Patient Measurements: Weight: 153 lb 8 oz (69.627 kg)  Vital Signs: Temp: 99.1 F (37.3 C) (12/25 0535) Temp Source: Oral (12/25 0535) BP: 113/62 mmHg (12/25 0535) Pulse Rate: 96 (12/25 0535) Intake/Output from previous day: 12/24 0701 - 12/25 0700 In: 750 [I.V.:700; IV Piggyback:50] Out: -  Intake/Output from this shift:    Labs:  Recent Labs  02/07/14 1708 02/07/14 1834 02/08/14 0425  WBC  --  16.9* 17.2*  HGB  --  10.7* 10.6*  PLT  --  <5* <5*  CREATININE 1.69*  --  1.81*   Estimated Creatinine Clearance: 31.7 mL/min (by C-G formula based on Cr of 1.81). No results for input(s): VANCOTROUGH, VANCOPEAK, VANCORANDOM, GENTTROUGH, GENTPEAK, GENTRANDOM, TOBRATROUGH, TOBRAPEAK, TOBRARND, AMIKACINPEAK, AMIKACINTROU, AMIKACIN in the last 72 hours.   Medical History: Past Medical History  Diagnosis Date  . Hypertension   . Hyperlipidemia   . BPH (benign prostatic hyperplasia)   . Kidney problem    Microbiology: 12/24 Blood x2: collected   Anti-infectives: 12/24 >> Zosyn >> 12/24 >> Vanc >>    Assessment: 68 y/o M sent from physician's office for evaluation of thrombocytopenia. Lab work with platelets resulted as 0. Also leukocytosis in 20s. Pt initially presented to office for evaluation of atraumatic R shoulder and R wrist pain/swelling.  Pharmacy consulted to dose vancomycin and Zosyn for sepsis.  Today, 12/25: D#2 vancomycin 750mg  IV q12h D#2 Zosyn 3.375 grams IV q8h (extended-infusion) Low-grade temp WBC elevated and rising, but on Solu-Medrol SCr rising.  Baseline unknown. Finished dose #1 of 2 IVIG for ITP. Pltc remains undetectable According to latest MD notes, unclear whether infection is present.  Potential source is R wrist but not certain whether septic joint vs hematoma (unable to tap in setting of profound  thrombocytopenia).   Goal of Therapy:  Vancomycin trough level 15-20 mcg/ml Appropriate antibiotic dosing for indication and renal function; eradication of infection.  Plan: 1. Reduce vancomycin to 750 mg IV q24h, next dose 10 PM. 2. Continue Zosyn 3.375 grams IV q8h (extended-infusion) 3. Recheck serum creatinine in AM. 4. Follow clinical course, renal function, culture results. 5. If vancomycin is continued will need serum level monitoring.  Clayburn Pert, PharmD, BCPS Pager: [phone] 02/08/2014  8:09 AM

## 2014-02-08 NOTE — Progress Notes (Signed)
[facility] PROGRESS NOTE  Steven Bell WUJ:[id number] DOB: [birth year] DOA: 02/07/2014 PCP: [name], MD  Assessment/Plan: 1. Acute ITP: Admitted for IV IG and IV steroids. Hematology consulted and recommendations given. No platelet transfusion unless starts actively bleeding.   SIRS with fever and leukocytosis: On broad spectrum antibiotics.   Stage 3 CKD: apperas to be at baseline. Will continue to monitor.    DVT prophylaxis.   Code Status: full code.  Family Communication: none atbedside Disposition Plan: pending.    Consultants:  Hematology.  Procedures:  none  Antibiotics:  IV vancomycin  IV zosyn.   HPI/Subjective: Persistent pain in the right wrist and shoulder. No nausea, vomiting.no sob, chest pain or headache or blurry vision.   Objective: Filed Vitals:   02/08/14 1340  BP: 107/59  Pulse: 74  Temp: 97.7 F (36.5 C)  Resp: 18    Intake/Output Summary (Last 24 hours) at 02/08/14 1643 Last data filed at 02/08/14 1515  Gross per 24 hour  Intake    870 ml  Output      0 ml  Net    870 ml   Filed Weights   02/07/14 2145 02/07/14 2225  Weight: 71.2 kg (156 lb 15.5 oz) 69.627 kg (153 lb 8 oz)    Exam:   General:  Alert afebrile comfortable  Cardiovascular: s1s2   Respiratory: ctah  Abdomen: soft non tender non distended bowel sounds heard  Musculoskeletal: petechia over the extremities and the rest of the body. Right shoulder and right wrist swelling and tenderness.   Data Reviewed: Basic Metabolic Panel:  Recent Labs Lab 02/07/14 1708 02/08/14 0425  NA 139 128*  K 4.2 4.0  CL 105 100  CO2 22 20  GLUCOSE 115* 195*  BUN 29* 29*  CREATININE 1.69* 1.81*  CALCIUM 9.6 8.4   Liver Function Tests:  Recent Labs Lab 02/07/14 1708  AST 23  ALT 14  ALKPHOS 62  BILITOT 0.9  PROT 9.0*  ALBUMIN 4.0   No results for input(s): LIPASE, AMYLASE in the last 168 hours. No results for input(s): AMMONIA in the last 168  hours. CBC:  Recent Labs Lab 02/07/14 1834 02/08/14 0425  WBC 16.9* 17.2*  NEUTROABS 12.9*  --   HGB 10.7* 10.6*  HCT 33.5* 32.3*  MCV 92.3 91.5  PLT <5* <5*   Cardiac Enzymes: No results for input(s): CKTOTAL, CKMB, CKMBINDEX, TROPONINI in the last 168 hours. BNP (last 3 results) No results for input(s): PROBNP in the last 8760 hours. CBG: No results for input(s): GLUCAP in the last 168 hours.  Recent Results (from the past 240 hour(s))  Blood culture (routine x 2)     Status: None (Preliminary result)   Collection Time: 02/07/14  5:08 PM  Result Value Ref Range Status   Specimen Description BLOOD LEFT HAND  Final   Special Requests BOTTLES DRAWN AEROBIC AND ANAEROBIC 5 CC  Final   Culture   Final           BLOOD CULTURE RECEIVED NO GROWTH TO DATE CULTURE WILL BE HELD FOR 5 DAYS BEFORE ISSUING A FINAL NEGATIVE REPORT Performed at [facility]    Report Status PENDING  Incomplete  Blood culture (routine x 2)     Status: None (Preliminary result)   Collection Time: 02/07/14  5:26 PM  Result Value Ref Range Status   Specimen Description BLOOD RIGHT ANTECUBITAL  Final   Special Requests BOTTLES DRAWN AEROBIC AND ANAEROBIC 3 CC  Final  Culture   Final           BLOOD CULTURE RECEIVED NO GROWTH TO DATE CULTURE WILL BE HELD FOR 5 DAYS BEFORE ISSUING A FINAL NEGATIVE REPORT Performed at [facility]    Report Status PENDING  Incomplete     Studies: No results found.  Scheduled Meds: . atorvastatin  10 mg Oral Daily  . hydrochlorothiazide  25 mg Oral Daily  . lisinopril  20 mg Oral Daily  . methylPREDNISolone (SOLU-MEDROL) injection  70 mg Intravenous Q12H  . metoprolol succinate  100 mg Oral Daily  . multivitamin with minerals  1 tablet Oral Daily  . IMMUNE GLOBULIN 10% (HUMAN) IV - For Fluid Restriction Only  1 g/kg Intravenous Q24H  . piperacillin-tazobactam (ZOSYN)  IV  3.375 g Intravenous Q8H  . sodium chloride  3 mL Intravenous Q12H  .  tamsulosin  0.4 mg Oral QHS  . vancomycin  750 mg Intravenous Q24H   Continuous Infusions:   Principal Problem:   Acute ITP Active Problems:   SIRS (systemic inflammatory response syndrome)   Renal impairment   Swelling of joint of right wrist    Time spent: 15 minutes    [facility] Pager [phone]. If 7PM-7AM, please contact night-coverage at [email or web address], password [facility] 02/08/2014, 4:43 PM  LOS: 1 day

## 2014-02-09 DIAGNOSIS — N289 Disorder of kidney and ureter, unspecified: Secondary | ICD-10-CM

## 2014-02-09 DIAGNOSIS — N179 Acute kidney failure, unspecified: Secondary | ICD-10-CM

## 2014-02-09 DIAGNOSIS — R319 Hematuria, unspecified: Secondary | ICD-10-CM

## 2014-02-09 DIAGNOSIS — M1990 Unspecified osteoarthritis, unspecified site: Secondary | ICD-10-CM

## 2014-02-09 LAB — CBC WITH DIFFERENTIAL/PLATELET
BASOS ABS: 0 10*3/uL (ref 0.0–0.1)
Basophils Relative: 0 % (ref 0–1)
EOS PCT: 0 % (ref 0–5)
Eosinophils Absolute: 0 10*3/uL (ref 0.0–0.7)
HEMATOCRIT: 26.7 % — AB (ref 39.0–52.0)
Hemoglobin: 8.8 g/dL — ABNORMAL LOW (ref 13.0–17.0)
LYMPHS ABS: 1 10*3/uL (ref 0.7–4.0)
Lymphocytes Relative: 5 % — ABNORMAL LOW (ref 12–46)
MCH: 29.6 pg (ref 26.0–34.0)
MCHC: 33 g/dL (ref 30.0–36.0)
MCV: 89.9 fL (ref 78.0–100.0)
MONO ABS: 1.2 10*3/uL — AB (ref 0.1–1.0)
Monocytes Relative: 7 % (ref 3–12)
NEUTROS ABS: 16.3 10*3/uL — AB (ref 1.7–7.7)
Neutrophils Relative %: 88 % — ABNORMAL HIGH (ref 43–77)
Platelets: <5 10*3/uL — CL (ref 150–400)
RBC: 2.97 MIL/uL — ABNORMAL LOW (ref 4.22–5.81)
RDW: 12.7 % (ref 11.5–15.5)
WBC: 18.5 10*3/uL — AB (ref 4.0–10.5)

## 2014-02-09 LAB — BASIC METABOLIC PANEL
Anion gap: 6 (ref 5–15)
BUN: 54 mg/dL — ABNORMAL HIGH (ref 6–23)
CHLORIDE: 101 meq/L (ref 96–112)
CO2: 22 mmol/L (ref 19–32)
Calcium: 8.3 mg/dL — ABNORMAL LOW (ref 8.4–10.5)
Creatinine, Ser: 2.2 mg/dL — ABNORMAL HIGH (ref 0.50–1.35)
GFR calc Af Amer: 33 mL/min — ABNORMAL LOW (ref >90)
GFR calc non Af Amer: 29 mL/min — ABNORMAL LOW (ref >90)
Glucose, Bld: 143 mg/dL — ABNORMAL HIGH (ref 70–99)
Potassium: 3.8 mmol/L (ref 3.5–5.1)
SODIUM: 129 mmol/L — AB (ref 135–145)

## 2014-02-09 LAB — RETICULOCYTES
RBC.: 3.41 MIL/uL — AB (ref 4.22–5.81)
RETIC COUNT ABSOLUTE: 40.9 10*3/uL (ref 19.0–186.0)
RETIC CT PCT: 1.2 % (ref 0.4–3.1)

## 2014-02-09 LAB — PROTEIN / CREATININE RATIO, URINE
Creatinine, Urine: 70.1 mg/dL
Protein Creatinine Ratio: 0.44 — ABNORMAL HIGH (ref 0.00–0.15)
TOTAL PROTEIN, URINE: 31 mg/dL

## 2014-02-09 LAB — VANCOMYCIN, TROUGH: Vancomycin Tr: 9 ug/mL — ABNORMAL LOW (ref 10.0–20.0)

## 2014-02-09 NOTE — Progress Notes (Addendum)
[facility] PROGRESS NOTE  Steven Bell GYB:[id number] DOB: April 18, 1945 DOA: 02/07/2014 PCP: [name], MD  Assessment/Plan: 1. Acute ITP: Admitted for IV IG and IV steroids. Hematology consulted and recommendations given. No platelet transfusion unless starts actively bleeding.   SIRS with fever and leukocytosis: On broad spectrum antibiotics.    Acute on CKD : Unclear etiology. Holding lisinopril and HCTZ. Will add hydralazine prn for BP control.  Pt reports getting US renal on Monday. We couldn't get hold of the report because of the weekend and the [facility] was closed.  UA showed hematuria, probably from the ITP. Cultures sent, repeat US renal ordered, urine electrolytes ordered and renal consulted for recommendations. H/o prostate hypertrophy.    Anemia: Drop from baseline hemoglobin of 10.5 . Stool for occult blood and anemia panel ordered.    Leukocytosis: Probably reactive. Vs SIRS .    Hyponatremia: ? medications Stopped HCTZ. Getting SIADH work up . He doesn't appear to be dehydrated.     DVT prophylaxis.   Code Status: full code.  Family Communication: none atbedside Disposition Plan: pending.    Consultants:  Hematology.  Nephrology.   Procedures:  none  Antibiotics:  IV vancomycin  IV zosyn.   HPI/Subjective: Mild hematuria. And frequent urination.  No nausea, vomiting.no sob, chest pain or headache or blurry vision.   Objective: Filed Vitals:   02/09/14 0520  BP: 105/56  Pulse: 70  Temp: 98.2 F (36.8 C)  Resp: 18    Intake/Output Summary (Last 24 hours) at 02/09/14 1605 Last data filed at 02/09/14 0804  Gross per 24 hour  Intake    340 ml  Output      0 ml  Net    340 ml   Filed Weights   02/07/14 2145 02/07/14 2225  Weight: 71.2 kg (156 lb 15.5 oz) 69.627 kg (153 lb 8 oz)    Exam:   General:  Alert afebrile comfortable  Cardiovascular: s1s2   Respiratory: ctah  Abdomen: soft non tender non  distended bowel sounds heard  Musculoskeletal: petechia over the extremities and the rest of the body. Right shoulder and right wrist swelling and tenderness.   Data Reviewed: Basic Metabolic Panel:  Recent Labs Lab 02/07/14 1708 02/08/14 0425 02/09/14 0535  NA 139 128* 129*  K 4.2 4.0 3.8  CL 105 100 101  CO2 22 20 22   GLUCOSE 115* 195* 143*  BUN 29* 29* 54*  CREATININE 1.69* 1.81* 2.20*  CALCIUM 9.6 8.4 8.3*   Liver Function Tests:  Recent Labs Lab 02/07/14 1708  AST 23  ALT 14  ALKPHOS 62  BILITOT 0.9  PROT 9.0*  ALBUMIN 4.0   No results for input(s): LIPASE, AMYLASE in the last 168 hours. No results for input(s): AMMONIA in the last 168 hours. CBC:  Recent Labs Lab 02/07/14 1834 02/08/14 0425 02/09/14 0535  WBC 16.9* 17.2* 18.5*  NEUTROABS 12.9*  --  16.3*  HGB 10.7* 10.6* 8.8*  HCT 33.5* 32.3* 26.7*  MCV 92.3 91.5 89.9  PLT <5* <5* <5*   Cardiac Enzymes: No results for input(s): CKTOTAL, CKMB, CKMBINDEX, TROPONINI in the last 168 hours. BNP (last 3 results) No results for input(s): PROBNP in the last 8760 hours. CBG: No results for input(s): GLUCAP in the last 168 hours.  Recent Results (from the past 240 hour(s))  Blood culture (routine x 2)     Status: None (Preliminary result)   Collection Time: 02/07/14  5:08 PM  Result Value Ref Range  Status   Specimen Description BLOOD LEFT HAND  Final   Special Requests BOTTLES DRAWN AEROBIC AND ANAEROBIC 5 CC  Final   Culture   Final           BLOOD CULTURE RECEIVED NO GROWTH TO DATE CULTURE WILL BE HELD FOR 5 DAYS BEFORE ISSUING A FINAL NEGATIVE REPORT Performed at [facility]    Report Status PENDING  Incomplete  Blood culture (routine x 2)     Status: None (Preliminary result)   Collection Time: 02/07/14  5:26 PM  Result Value Ref Range Status   Specimen Description BLOOD RIGHT ANTECUBITAL  Final   Special Requests BOTTLES DRAWN AEROBIC AND ANAEROBIC 3 CC  Final   Culture   Final            BLOOD CULTURE RECEIVED NO GROWTH TO DATE CULTURE WILL BE HELD FOR 5 DAYS BEFORE ISSUING A FINAL NEGATIVE REPORT Performed at [facility]    Report Status PENDING  Incomplete     Studies: No results found.  Scheduled Meds: . atorvastatin  10 mg Oral Daily  . hydrochlorothiazide  25 mg Oral Daily  . lisinopril  20 mg Oral Daily  . methylPREDNISolone (SOLU-MEDROL) injection  70 mg Intravenous Q12H  . metoprolol succinate  100 mg Oral Daily  . multivitamin with minerals  1 tablet Oral Daily  . piperacillin-tazobactam (ZOSYN)  IV  3.375 g Intravenous Q8H  . sodium chloride  3 mL Intravenous Q12H  . tamsulosin  0.4 mg Oral QHS  . vancomycin  750 mg Intravenous Q24H   Continuous Infusions:   Principal Problem:   Acute ITP Active Problems:   SIRS (systemic inflammatory response syndrome)   Renal impairment   Swelling of joint of right wrist    Time spent: 15 minutes    [facility] Pager [phone]. If 7PM-7AM, please contact night-coverage at [email or web address], password [facility] 02/09/2014, 4:05 PM  LOS: 2 days

## 2014-02-09 NOTE — Progress Notes (Signed)
ANTIBIOTIC CONSULT NOTE - Follow Up  Pharmacy Consult for vancomycin, Zosyn Indication: SIRS, r/o sepsis  No Known Allergies  Patient Measurements: Weight: 153 lb 8 oz (69.627 kg)  Vital Signs: Temp: 98.2 F (36.8 C) (12/26 0520) Temp Source: Oral (12/26 0520) BP: 105/56 mmHg (12/26 0520) Pulse Rate: 70 (12/26 0520) Intake/Output from previous day: 12/25 0701 - 12/26 0700 In: 220 [P.O.:120; IV Piggyback:100] Out: -  Intake/Output from this shift: Total I/O In: 240 [P.O.:240] Out: -   Labs:  Recent Labs  02/07/14 1708 02/07/14 1834 02/08/14 0425 02/09/14 0535  WBC  --  16.9* 17.2* 18.5*  HGB  --  10.7* 10.6* 8.8*  PLT  --  <5* <5* <5*  CREATININE 1.69*  --  1.81* 2.20*   Estimated Creatinine Clearance: 26 mL/min (by C-G formula based on Cr of 2.2). No results for input(s): VANCOTROUGH, VANCOPEAK, VANCORANDOM, GENTTROUGH, GENTPEAK, GENTRANDOM, TOBRATROUGH, TOBRAPEAK, TOBRARND, AMIKACINPEAK, AMIKACINTROU, AMIKACIN in the last 72 hours.   Medical History: Past Medical History  Diagnosis Date  . Hypertension   . Hyperlipidemia   . BPH (benign prostatic hyperplasia)   . Kidney problem    Microbiology: 12/24 Blood x2: collected  Anti-infectives: 12/24 >> Zosyn >> 12/24 >> Vanc >>   Assessment: 68 y/o M sent from physician's office for evaluation of thrombocytopenia. Lab work with platelets resulted as 0. Also leukocytosis in 20s. Pt initially presented to office for evaluation of atraumatic R shoulder and R wrist pain/swelling.  Pharmacy consulted to dose vancomycin and Zosyn for sepsis.  Today, 12/26: D#3 vancomycin 750mg  IV q24h D#3 Zosyn 3.375 grams IV q8h (extended-infusion) Afebrile from 12/25 WBC elevated and rising, but on Solu-Medrol SCr continues to rise (2.2 today).  Baseline unknown. Finished 2 doses IVIG for ITP. Pltc remain < 5 According to latest MD notes, unclear whether infection is present.  Potential source is R wrist but not certain  whether septic joint vs hematoma (unable to tap in setting of profound thrombocytopenia).  Goal of Therapy:  Vancomycin trough level 15-20 mcg/ml Appropriate antibiotic dosing for indication and renal function; eradication of infection.  Plan:  Vancomycin was reduced to 750 mg IV q24h,   No change Zosyn 3.375 grams IV q8h (extended-infusion)  Check Vanc trough at 2100 tonight, not yet at steady state, but SCr continues to increase  Follow clinical course, renal function, culture results.  Minda Ditto PharmD Pager [phone] 02/09/2014, 11:33 AM

## 2014-02-09 NOTE — Progress Notes (Signed)
Pharmacy - Vancomycin  Vanc trough = 9 mg/L, subtherapeutic after two doses of 750mg  q24h but expect it will accumulate and SCr is rising.  Cont Vanc 750mg  q24h for now and follow-up closely.  Romeo Rabon, PharmD, pager [phone]. 02/09/2014,11:47 PM.

## 2014-02-09 NOTE — Progress Notes (Signed)
SUBJECTIVE: Mild hematuria otherwise no bruising or bleeding  OBJECTIVE PHYSICAL EXAMINATION:  Filed Vitals:   02/09/14 0520  BP: 105/56  Pulse: 70  Temp: 98.2 F (36.8 C)  Resp: 18   Filed Weights   02/07/14 2145 02/07/14 2225  Weight: 156 lb 15.5 oz (71.2 kg) 153 lb 8 oz (69.627 kg)    GENERAL:alert, no distress and comfortable SKIN: Small bruises of the IV sites EYES: normal, Conjunctiva are pink and non-injected, sclera clear OROPHARYNX:no exudate, no erythema and lips, buccal mucosa, and tongue normal  LYMPH:  no palpable lymphadenopathy in the cervical, axillary or inguinal LUNGS: clear to auscultation  HEART: regular rate & rhythm and no murmurs ABDOMEN:abdomen soft, non-tender and normal bowel sounds Musculoskeletal: Swelling around the wrist is slightly better  NEURO: alert & oriented x 3 with fluent speech, no focal motor/sensory deficits  LABORATORY DATA:  I have reviewed the data as listed @LASTCHEMISTRY @  Lab Results  Component Value Date   WBC 18.5* 02/09/2014   HGB 8.8* 02/09/2014   HCT 26.7* 02/09/2014   MCV 89.9 02/09/2014   PLT <5* 02/09/2014   NEUTROABS 16.3* 02/09/2014    ASSESSMENT AND PLAN: 1. ITP: Continuing to treat him with steroids. He completed 2 doses of IVIG as of early this morning. He did not have schistocytes presentation. Hence the diagnosis is most likely immune thrombocytopenia. Patients can have delayed response to steroids. We can continue to watch and monitor him with daily CBCs. There is no indication for that transfusion unless he has severe bleeding.  2. Worsening anemia: Along with worsening renal function. Patient had an outpatient renal ultrasound at an outside facility. He was supposed to see nephrology early January. He does not know the cause of his renal dysfunction.  3. Joint inflammation/infection currently on vancomycin and Zosyn. I will leave it to the admitting team to adjust his dosages accordingly.  Monitor with  daily CBC with differential.

## 2014-02-09 NOTE — Consult Note (Signed)
Reason for Consult: Acute renal failure on chronic kidney disease stage III Referring Physician: Hosie Poisson MD [facility])  HPI:  68 year old Caucasian man with past medical history significant for hypertension for 30 years, dyslipidemia, problems with BPH for the past 15 months or so with persistent hesitancy, poor stream, frequency and urge incontinence who recently underwent ultrasound of his urinary tract-in part because of worsening creatinine and worsening symptoms. Admitted to the hospital with a platelet count of 0 associated with gingival bleeding and a petechial rash of his ankles and wrists as well as atraumatic, painful right wrist and shoulder swelling. Seen by hematology and felt to have ITP-consequently started on corticosteroids and IVIG.   Concern is raised because his creatinine was elevated on admission and continues to rise. Dr. Karleen Hampshire try to contact his primary care provider's office earlier today for prior records including labs and renal ultrasound but was unable to get these. The patient reports lower urinary tract symptoms all started after a urinary tract infection 15 months ago. He denies any history of kidney stones or renal insufficiency. Denies any knowledge of proteinuria or foamy urine. 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 Has a upcoming appointment with urology for worsening renal function/PSA. Reports taking 2-4 tablets of Aleve daily for the past 6 months or so-partly because of his arthralgias of the shoulder and wrist but also because this medication decreased his urge to urinate. He was on lisinopril 20 mg daily and hydrochlorothiazide 25 mg daily-both of which have been discontinued earlier.  He denies any recurrent nausea, vomiting or diarrhea. Denies any flank pain, fever or chills. Denies any personal or familial history of autoimmune disorders. Denies any strong familial history of disease.    02/07/2014   02/08/2014  02/09/2014   Sodium 139 128 (L) 129 (L)  BUN 29 (H) 29 (H) 54 (H)  Creatinine 1.69 (H) 1.81 (H) 2.20 (H)    Past Medical History  Diagnosis Date  . Hypertension   . Hyperlipidemia   . BPH (benign prostatic hyperplasia)   . Kidney problem     History reviewed. No pertinent past surgical history.  No family history on file.  Social History:  reports that he has never smoked. He does not have any smokeless tobacco history on file. He reports that he does not drink alcohol or use illicit drugs.  Allergies: No Known Allergies  Medications:  Scheduled: . atorvastatin  10 mg Oral Daily  . methylPREDNISolone (SOLU-MEDROL) injection  70 mg Intravenous Q12H  . metoprolol succinate  100 mg Oral Daily  . multivitamin with minerals  1 tablet Oral Daily  . piperacillin-tazobactam (ZOSYN)  IV  3.375 g Intravenous Q8H  . sodium chloride  3 mL Intravenous Q12H  . tamsulosin  0.4 mg Oral QHS  . vancomycin  750 mg Intravenous Q24H    Results for orders placed or performed during the hospital encounter of 02/07/14 (from the past 48 hour(s))  APTT     Status: None   Collection Time: 02/07/14  5:08 PM  Result Value Ref Range   aPTT 29 24 - 37 seconds  Protime-INR     Status: None   Collection Time: 02/07/14  5:08 PM  Result Value Ref Range   Prothrombin Time 13.7 11.6 - 15.2 seconds   INR 1.04 0.00 - 1.49  Type and screen     Status: None   Collection Time: 02/07/14  5:08 PM  Result Value Ref  Range   ABO/RH(D) O POS    Antibody Screen NEG    Sample Expiration 02/10/2014   Rapid HIV screen     Status: None   Collection Time: 02/07/14  5:08 PM  Result Value Ref Range   SUDS Rapid HIV Screen NON REACTIVE NON REACTIVE    Comment: RESULT CALLED TO, READ BACK BY AND VERIFIED WITH: Willy Eddy RN 1802 02/07/14 A NAVARRO   Comprehensive metabolic panel     Status: Abnormal   Collection Time: 02/07/14  5:08 PM  Result Value Ref Range   Sodium 139 135 - 145 mmol/L    Comment:  Please note change in reference range.   Potassium 4.2 3.5 - 5.1 mmol/L    Comment: Please note change in reference range.   Chloride 105 96 - 112 mEq/L   CO2 22 19 - 32 mmol/L   Glucose, Bld 115 (H) 70 - 99 mg/dL   BUN 29 (H) 6 - 23 mg/dL   Creatinine, Ser 1.69 (H) 0.50 - 1.35 mg/dL   Calcium 9.6 8.4 - 10.5 mg/dL   Total Protein 9.0 (H) 6.0 - 8.3 g/dL   Albumin 4.0 3.5 - 5.2 g/dL   AST 23 0 - 37 U/L   ALT 14 0 - 53 U/L   Alkaline Phosphatase 62 39 - 117 U/L   Total Bilirubin 0.9 0.3 - 1.2 mg/dL   GFR calc non Af Amer 40 (L) >90 mL/min   GFR calc Af Amer 46 (L) >90 mL/min    Comment: (NOTE) The eGFR has been calculated using the CKD EPI equation. This calculation has not been validated in all clinical situations. eGFR's persistently <90 mL/min signify possible Chronic Kidney Disease.    Anion gap 12 5 - 15  Blood culture (routine x 2)     Status: None (Preliminary result)   Collection Time: 02/07/14  5:08 PM  Result Value Ref Range   Specimen Description BLOOD LEFT HAND    Special Requests BOTTLES DRAWN AEROBIC AND ANAEROBIC 5 CC    Culture             BLOOD CULTURE RECEIVED NO GROWTH TO DATE CULTURE WILL BE HELD FOR 5 DAYS BEFORE ISSUING A FINAL NEGATIVE REPORT Performed at [facility]    Report Status PENDING   Blood culture (routine x 2)     Status: None (Preliminary result)   Collection Time: 02/07/14  5:26 PM  Result Value Ref Range   Specimen Description BLOOD RIGHT ANTECUBITAL    Special Requests BOTTLES DRAWN AEROBIC AND ANAEROBIC 3 CC    Culture             BLOOD CULTURE RECEIVED NO GROWTH TO DATE CULTURE WILL BE HELD FOR 5 DAYS BEFORE ISSUING A FINAL NEGATIVE REPORT Performed at [facility]    Report Status PENDING   CBC with Differential     Status: Abnormal   Collection Time: 02/07/14  6:34 PM  Result Value Ref Range   WBC 16.9 (H) 4.0 - 10.5 K/uL   RBC 3.63 (L) 4.22 - 5.81 MIL/uL   Hemoglobin 10.7 (L) 13.0 - 17.0 g/dL   HCT 33.5 (L)  39.0 - 52.0 %   MCV 92.3 78.0 - 100.0 fL   MCH 29.5 26.0 - 34.0 pg   MCHC 31.9 30.0 - 36.0 g/dL   RDW 12.9 11.5 - 15.5 %   Platelets <5 (LL) 150 - 400 K/uL    Comment: REPEATED TO VERIFY SPECIMEN CHECKED  FOR CLOTS PLATELET COUNT CONFIRMED BY SMEAR CRITICAL RESULT CALLED TO, READ BACK BY AND VERIFIED WITH: F DUDLEY RN 1802 02/07/14 A NAVARRO    Neutrophils Relative % 76 43 - 77 %   Neutro Abs 12.9 (H) 1.7 - 7.7 K/uL   Lymphocytes Relative 17 12 - 46 %   Lymphs Abs 2.8 0.7 - 4.0 K/uL   Monocytes Relative 7 3 - 12 %   Monocytes Absolute 1.2 (H) 0.1 - 1.0 K/uL   Eosinophils Relative 0 0 - 5 %   Eosinophils Absolute 0.0 0.0 - 0.7 K/uL   Basophils Relative 0 0 - 1 %   Basophils Absolute 0.0 0.0 - 0.1 K/uL   WBC Morphology TOXIC GRANULATION   ABO/Rh     Status: None   Collection Time: 02/07/14  6:34 PM  Result Value Ref Range   ABO/RH(D) O POS   Reticulocytes     Status: Abnormal   Collection Time: 02/07/14  6:34 PM  Result Value Ref Range   Retic Ct Pct 0.9 0.4 - 3.1 %   RBC. 3.63 (L) 4.22 - 5.81 MIL/uL   Retic Count, Manual 32.7 19.0 - 186.0 K/uL  Save smear     Status: None   Collection Time: 02/07/14  8:16 PM  Result Value Ref Range   Smear Review SMEAR STAINED AND AVAILABLE FOR REVIEW   CBC     Status: Abnormal   Collection Time: 02/08/14  4:25 AM  Result Value Ref Range   WBC 17.2 (H) 4.0 - 10.5 K/uL   RBC 3.53 (L) 4.22 - 5.81 MIL/uL   Hemoglobin 10.6 (L) 13.0 - 17.0 g/dL   HCT 73.8 (L) 47.9 - 75.6 %   MCV 91.5 78.0 - 100.0 fL   MCH 30.0 26.0 - 34.0 pg   MCHC 32.8 30.0 - 36.0 g/dL   RDW 78.7 05.3 - 49.5 %   Platelets <5 (LL) 150 - 400 K/uL    Comment: REPEATED TO VERIFY CRITICAL VALUE NOTED.  VALUE IS CONSISTENT WITH PREVIOUSLY REPORTED AND CALLED VALUE.   Basic metabolic panel     Status: Abnormal   Collection Time: 02/08/14  4:25 AM  Result Value Ref Range   Sodium 128 (L) 135 - 145 mmol/L    Comment: Please note change in reference range. DELTA CHECK  NOTED REPEATED TO VERIFY    Potassium 4.0 3.5 - 5.1 mmol/L    Comment: Please note change in reference range.   Chloride 100 96 - 112 mEq/L   CO2 20 19 - 32 mmol/L   Glucose, Bld 195 (H) 70 - 99 mg/dL   BUN 29 (H) 6 - 23 mg/dL   Creatinine, Ser 4.91 (H) 0.50 - 1.35 mg/dL   Calcium 8.4 8.4 - 75.8 mg/dL   GFR calc non Af Amer 36 (L) >90 mL/min   GFR calc Af Amer 42 (L) >90 mL/min    Comment: (NOTE) The eGFR has been calculated using the CKD EPI equation. This calculation has not been validated in all clinical situations. eGFR's persistently <90 mL/min signify possible Chronic Kidney Disease.    Anion gap 8 5 - 15  Uric acid     Status: Abnormal   Collection Time: 02/08/14  4:25 AM  Result Value Ref Range   Uric Acid, Serum 8.3 (H) 4.0 - 7.8 mg/dL  Lactate dehydrogenase     Status: None   Collection Time: 02/08/14  4:25 AM  Result Value Ref Range   LDH 106 94 -  250 U/L  PSA     Status: Abnormal   Collection Time: 02/08/14  4:25 AM  Result Value Ref Range   PSA 5.96 (H) <=4.00 ng/mL    Comment: (NOTE) Test Methodology: ECLIA PSA (Electrochemiluminescence Immunoassay) For PSA values from 2.5-4.0, particularly in younger men <74 years old, the AUA and NCCN suggest testing for % Free PSA (3515) and evaluation of the rate of increase in PSA (PSA velocity). Performed at [facility]   Urinalysis, Routine w reflex microscopic     Status: Abnormal   Collection Time: 02/08/14  6:18 AM  Result Value Ref Range   Color, Urine YELLOW YELLOW   APPearance CLOUDY (A) CLEAR   Specific Gravity, Urine 1.021 1.005 - 1.030   pH 5.5 5.0 - 8.0   Glucose, UA >1000 (A) NEGATIVE mg/dL   Hgb urine dipstick LARGE (A) NEGATIVE   Bilirubin Urine NEGATIVE NEGATIVE   Ketones, ur NEGATIVE NEGATIVE mg/dL   Protein, ur 30 (A) NEGATIVE mg/dL   Urobilinogen, UA 0.2 0.0 - 1.0 mg/dL   Nitrite NEGATIVE NEGATIVE   Leukocytes, UA SMALL (A) NEGATIVE  Urine microscopic-add on     Status: None    Collection Time: 02/08/14  6:18 AM  Result Value Ref Range   Squamous Epithelial / LPF RARE RARE   WBC, UA TOO NUMEROUS TO COUNT <3 WBC/hpf   RBC / HPF TOO NUMEROUS TO COUNT <3 RBC/hpf  Basic metabolic panel     Status: Abnormal   Collection Time: 02/09/14  5:35 AM  Result Value Ref Range   Sodium 129 (L) 135 - 145 mmol/L    Comment: Please note change in reference range.   Potassium 3.8 3.5 - 5.1 mmol/L    Comment: Please note change in reference range.   Chloride 101 96 - 112 mEq/L   CO2 22 19 - 32 mmol/L   Glucose, Bld 143 (H) 70 - 99 mg/dL   BUN 54 (H) 6 - 23 mg/dL    Comment: DELTA CHECK NOTED REPEATED TO VERIFY    Creatinine, Ser 2.20 (H) 0.50 - 1.35 mg/dL   Calcium 8.3 (L) 8.4 - 10.5 mg/dL   GFR calc non Af Amer 29 (L) >90 mL/min   GFR calc Af Amer 33 (L) >90 mL/min    Comment: (NOTE) The eGFR has been calculated using the CKD EPI equation. This calculation has not been validated in all clinical situations. eGFR's persistently <90 mL/min signify possible Chronic Kidney Disease.    Anion gap 6 5 - 15  CBC with Differential     Status: Abnormal   Collection Time: 02/09/14  5:35 AM  Result Value Ref Range   WBC 18.5 (H) 4.0 - 10.5 K/uL   RBC 2.97 (L) 4.22 - 5.81 MIL/uL   Hemoglobin 8.8 (L) 13.0 - 17.0 g/dL    Comment: DELTA CHECK NOTED REPEATED TO VERIFY    HCT 26.7 (L) 39.0 - 52.0 %   MCV 89.9 78.0 - 100.0 fL   MCH 29.6 26.0 - 34.0 pg   MCHC 33.0 30.0 - 36.0 g/dL   RDW 12.7 11.5 - 15.5 %   Platelets <5 (LL) 150 - 400 K/uL    Comment: REPEATED TO VERIFY CRITICAL VALUE NOTED.  VALUE IS CONSISTENT WITH PREVIOUSLY REPORTED AND CALLED VALUE.    Neutrophils Relative % 88 (H) 43 - 77 %   Neutro Abs 16.3 (H) 1.7 - 7.7 K/uL   Lymphocytes Relative 5 (L) 12 - 46 %   Lymphs  Abs 1.0 0.7 - 4.0 K/uL   Monocytes Relative 7 3 - 12 %   Monocytes Absolute 1.2 (H) 0.1 - 1.0 K/uL   Eosinophils Relative 0 0 - 5 %   Eosinophils Absolute 0.0 0.0 - 0.7 K/uL   Basophils Relative 0  0 - 1 %   Basophils Absolute 0.0 0.0 - 0.1 K/uL    No results found.  Review of Systems  Constitutional: Negative.   HENT: Negative for ear pain, hearing loss and tinnitus.        Gingival bleed  Eyes: Negative.   Respiratory: Negative.   Cardiovascular: Negative.   Gastrointestinal: Negative.   Genitourinary: Positive for urgency, frequency and hematuria.  Musculoskeletal: Positive for joint pain. Negative for myalgias, back pain and neck pain.  Skin: Positive for rash. Negative for itching.       Petechial rash- ankles and wrists  Neurological: Negative.   Endo/Heme/Allergies: Bruises/bleeds easily.  Psychiatric/Behavioral: Negative.    Blood pressure 105/56, pulse 70, temperature 98.2 F (36.8 C), temperature source Oral, resp. rate 18, weight 69.627 kg (153 lb 8 oz), SpO2 95 %. Physical Exam  Nursing note and vitals reviewed. Constitutional: He is oriented to person, place, and time. He appears well-developed and well-nourished. No distress.  HENT:  Head: Normocephalic and atraumatic.  Nose: Nose normal.  Mouth/Throat: No oropharyngeal exudate.  Eyes: Conjunctivae and EOM are normal. Pupils are equal, round, and reactive to light. No scleral icterus.  Neck: Normal range of motion. Neck supple. No JVD present. No tracheal deviation present. No thyromegaly present.  Cardiovascular: Normal rate, regular rhythm and intact distal pulses.  Exam reveals no friction rub.   No murmur heard. Respiratory: Effort normal and breath sounds normal. No respiratory distress. He has no wheezes. He has no rales.  GI: Soft. Bowel sounds are normal. He exhibits no distension. There is no tenderness. There is no rebound.  Musculoskeletal: Normal range of motion. He exhibits no edema or tenderness.  Lymphadenopathy:    He has no cervical adenopathy.  Neurological: He is alert and oriented to person, place, and time. No cranial nerve deficit. Coordination normal.  Skin: Skin is warm. Rash noted.   Bilateral LE petechiae  Psychiatric: He has a normal mood and affect. His behavior is normal.    Assessment/Plan: 1. Acute renal failure on chronic kidney disease stage III: Based on the history, timeline of events and available database-it appears that his underlying chronic kidney disease is likely from hypertension and nonsteroidal associated nephropathy. It will be of paramount importance to get the results of his renal ultrasound versus obtain a new renal ultrasound in order to ensure that this is not some form of obstructive uropathy given his chronicity of symptoms. I agree with temporary stoppage of hydrochlorothiazide and lisinopril as he is in acute on chronic renal failure. The exact etiology of his acute on chronic renal failure is not clear but appears to be hemodynamically mediated. No acute dialysis needs noted at this time. Urine output is not charted. Given the presence of hematuria (which may be simply from BPH), I will check an antinuclear antibody and ANCA. We'll continue to monitor him closely and intervene as required. 2. Hyponatremia: this appears to be multifactorial -from hydrochlorothiazide use, acute on chronic renal failure (impaired freewater excretion) and recent IVIG infusion. Agree with obtaining serum and urine osmolalities. 3. ITP: Management with corticosteroids and IVIG per hematology 4. Anemia: No ongoing overt loss-may have had some soft tissue hemorrhage. Further management per  hematology. 5. Hypertension: On treatment with metoprolol and some pleiotropic benefit from Flomax. We'll continue to monitor off HCTZ/lisinopril. 6. Possible sepsis: On broad-spectrum antimicrobial coverage with vancomycin and Zosyn  Dody Smartt K. 02/09/2014, 4:56 PM

## 2014-02-09 NOTE — Progress Notes (Signed)
IV octagam complete pt. Tolerated well no s/sx of reaction noted.

## 2014-02-10 ENCOUNTER — Inpatient Hospital Stay (HOSPITAL_COMMUNITY): Payer: PRIVATE HEALTH INSURANCE

## 2014-02-10 LAB — CBC WITH DIFFERENTIAL/PLATELET
BASOS PCT: 0 % (ref 0–1)
Basophils Absolute: 0 10*3/uL (ref 0.0–0.1)
EOS PCT: 0 % (ref 0–5)
Eosinophils Absolute: 0 10*3/uL (ref 0.0–0.7)
HEMATOCRIT: 28.3 % — AB (ref 39.0–52.0)
HEMOGLOBIN: 9.4 g/dL — AB (ref 13.0–17.0)
LYMPHS PCT: 8 % — AB (ref 12–46)
Lymphs Abs: 1.3 10*3/uL (ref 0.7–4.0)
MCH: 29.9 pg (ref 26.0–34.0)
MCHC: 33.2 g/dL (ref 30.0–36.0)
MCV: 90.1 fL (ref 78.0–100.0)
Monocytes Absolute: 0.6 10*3/uL (ref 0.1–1.0)
Monocytes Relative: 4 % (ref 3–12)
NEUTROS ABS: 14.1 10*3/uL — AB (ref 1.7–7.7)
Neutrophils Relative %: 88 % — ABNORMAL HIGH (ref 43–77)
Platelets: <5 10*3/uL — CL (ref 150–400)
RBC: 3.14 MIL/uL — AB (ref 4.22–5.81)
RDW: 12.8 % (ref 11.5–15.5)
WBC: 16 10*3/uL — ABNORMAL HIGH (ref 4.0–10.5)

## 2014-02-10 LAB — TSH: TSH: 0.416 u[IU]/mL (ref 0.350–4.500)

## 2014-02-10 LAB — SODIUM, URINE, RANDOM: SODIUM UR: 25 meq/L

## 2014-02-10 LAB — RENAL FUNCTION PANEL
ANION GAP: 6 (ref 5–15)
Albumin: 2.6 g/dL — ABNORMAL LOW (ref 3.5–5.2)
BUN: 63 mg/dL — ABNORMAL HIGH (ref 6–23)
CO2: 23 mmol/L (ref 19–32)
Calcium: 8.4 mg/dL (ref 8.4–10.5)
Chloride: 107 mEq/L (ref 96–112)
Creatinine, Ser: 2.09 mg/dL — ABNORMAL HIGH (ref 0.50–1.35)
GFR, EST AFRICAN AMERICAN: 36 mL/min — AB (ref >90)
GFR, EST NON AFRICAN AMERICAN: 31 mL/min — AB (ref >90)
Glucose, Bld: 142 mg/dL — ABNORMAL HIGH (ref 70–99)
PHOSPHORUS: 4.8 mg/dL — AB (ref 2.3–4.6)
POTASSIUM: 3.7 mmol/L (ref 3.5–5.1)
SODIUM: 136 mmol/L (ref 135–145)

## 2014-02-10 LAB — OSMOLALITY, URINE: Osmolality, Ur: 416 mOsm/kg (ref 390–1090)

## 2014-02-10 LAB — FOLATE: Folate: 16 ng/mL

## 2014-02-10 LAB — VITAMIN B12: VITAMIN B 12: 1100 pg/mL — AB (ref 211–911)

## 2014-02-10 LAB — CREATININE, URINE, RANDOM: Creatinine, Urine: 77.5 mg/dL

## 2014-02-10 LAB — CORTISOL: CORTISOL PLASMA: 10.4 ug/dL

## 2014-02-10 LAB — IRON AND TIBC
Iron: 67 ug/dL (ref 42–165)
SATURATION RATIOS: 34 % (ref 20–55)
TIBC: 196 ug/dL — ABNORMAL LOW (ref 215–435)
UIBC: 129 ug/dL (ref 125–400)

## 2014-02-10 LAB — OSMOLALITY: OSMOLALITY: 317 mosm/kg — AB (ref 275–300)

## 2014-02-10 LAB — FERRITIN: Ferritin: 1071 ng/mL — ABNORMAL HIGH (ref 22–322)

## 2014-02-10 MED ORDER — DEXAMETHASONE SODIUM PHOSPHATE 4 MG/ML IJ SOLN
8.0000 mg | INTRAMUSCULAR | Status: DC
  Administered 2014-02-10 – 2014-02-11 (×2): 8 mg via INTRAVENOUS
  Filled 2014-02-10 (×2): qty 2

## 2014-02-10 NOTE — Progress Notes (Signed)
Pt refused abdominal ultrasound. States he just had one this past Monday and shouldn't need another one today.

## 2014-02-10 NOTE — Progress Notes (Signed)
Patient ID: Steven Bell, male   DOB: 1945-02-25, 68 y.o.   MRN: [id number]  [facility] Progress Note   Assessment/ Plan:   1. Acute renal failure on chronic kidney disease stage III: Appears to have chronic kidney disease from underlying hypertension/NSAID nephropathy with recent worsening-probably hemodynamically mediated with hospitalization and ongoing ACE inhibitor/diuretic use. Creatinine somewhat improved versus unchanged overnight-BUN elevated due to corticosteroid use. Will await renal ultrasound from his PCPs office tomorrow. Serologies pending. Unimpressive proteinuria-0.4 g. Urine output inaccurate. 2. Hyponatremia: this appears to be multifactorial -from hydrochlorothiazide use, acute on chronic renal failure (impaired freewater excretion) and recent IVIG infusion. Sodium levels improving as seen with labs today. 3. ITP: Management with corticosteroids and IVIG per hematology-platelet count unchanged at less than 5 4. Anemia: No ongoing overt loss-may have had some soft tissue hemorrhage. Iron stores appear to be replete with elevated folic acid and Q75 levels. 5. Hypertension: Continue to monitor off HCTZ/lisinopril. Blood pressures well controlled on metoprolol and tamsulosin. 6. Possible sepsis: Urine cultures and blood cultures are negative to date-on broad-spectrum antimicrobial coverage with vancomycin and Zosyn  Subjective:   Reports to be feeling fair-denies any emerging complaints    Objective:   BP 111/60 mmHg  Pulse 61  Temp(Src) 98.3 F (36.8 C) (Oral)  Resp 18  Wt 69.627 kg (153 lb 8 oz)  SpO2 98%  Intake/Output Summary (Last 24 hours) at 02/10/14 1308 Last data filed at 02/10/14 9163  Gross per 24 hour  Intake    480 ml  Output      0 ml  Net    480 ml   Weight change:   Physical Exam: Gen: Comfortably resting in bed, watching football game CVS: Regular in rate and rhythm, S1 and S2 normal Resp: Clear to auscultation, no  rales/rhonchi Abd: Soft, obese, nontender and bowel sounds are normal Ext: No lower extremity edema-petechial rash over ankles/legs  Imaging: No results found.  Labs: BMET  Recent Labs Lab 02/07/14 1708 02/08/14 0425 02/09/14 0535 02/10/14 0514  NA 139 128* 129* 136  K 4.2 4.0 3.8 3.7  CL 105 100 101 107  CO2 22 20 22 23   GLUCOSE 115* 195* 143* 142*  BUN 29* 29* 54* 63*  CREATININE 1.69* 1.81* 2.20* 2.09*  CALCIUM 9.6 8.4 8.3* 8.4  PHOS  --   --   --  4.8*   CBC  Recent Labs Lab 02/07/14 1834 02/08/14 0425 02/09/14 0535 02/10/14 0514  WBC 16.9* 17.2* 18.5* 16.0*  NEUTROABS 12.9*  --  16.3* 14.1*  HGB 10.7* 10.6* 8.8* 9.4*  HCT 33.5* 32.3* 26.7* 28.3*  MCV 92.3 91.5 89.9 90.1  PLT <5* <5* <5* <5*    Medications:    . atorvastatin  10 mg Oral Daily  . dexamethasone  8 mg Intravenous Q24H  . metoprolol succinate  100 mg Oral Daily  . multivitamin with minerals  1 tablet Oral Daily  . piperacillin-tazobactam (ZOSYN)  IV  3.375 g Intravenous Q8H  . sodium chloride  3 mL Intravenous Q12H  . tamsulosin  0.4 mg Oral QHS  . vancomycin  750 mg Intravenous Q24H   Elmarie Shiley, MD 02/10/2014, 1:08 PM

## 2014-02-10 NOTE — Progress Notes (Signed)
SUBJECTIVE: Improvement of the swelling around the wrist and shoulder. Platelets have not improved. Anemia slightly better  OBJECTIVE PHYSICAL EXAMINATION:  Filed Vitals:   02/10/14 0637  BP: 111/60  Pulse: 61  Temp: 98.3 F (36.8 C)  Resp: 18   Filed Weights   02/07/14 2145 02/07/14 2225  Weight: 156 lb 15.5 oz (71.2 kg) 153 lb 8 oz (69.627 kg)    GENERAL:alert, no distress and comfortable SKIN: skin color, texture, turgor are normal, no rashes or significant lesions OROPHARYNX:no exudate, no erythema and lips, buccal mucosa, and tongue normal  LYMPH:  no palpable lymphadenopathy in the cervical, axillary or inguinal LUNGS: clear to auscultation and percussion with normal breathing effort HEART: regular rate & rhythm and no murmurs and no lower extremity edema ABDOMEN:abdomen soft, non-tender and normal bowel sounds Musculoskeletal:no cyanosis of digits and no clubbing  NEURO: alert & oriented x 3 with fluent speech, no focal motor/sensory deficits  LABORATORY DATA:  I have reviewed the data as listed @LASTCHEMISTRY @  Lab Results  Component Value Date   WBC 16.0* 02/10/2014   HGB 9.4* 02/10/2014   HCT 28.3* 02/10/2014   MCV 90.1 02/10/2014   PLT <5* 02/10/2014   NEUTROABS 14.1* 02/10/2014    ASSESSMENT AND PLAN: 1. ITP: Since the patient is not responding to methylprednisone, I would like to switch him to dexamethasone to see if there is any improvement. It is also certainly possible that he may have a delayed response to steroids and IVIG.  Since the patient is clinically improving from the wrist swelling, once his platelets start to improve, he could be discharged home for follow-up on oral prednisone therapy as an outpatient with Dr. Benay Spice.  2. Renal failure: Patient had an ultrasound as outpatient. Unfortunately they are not open on the weekend. Records have been requested.  Dr.Sherrill will take over his case from tomorrow.

## 2014-02-10 NOTE — Progress Notes (Signed)
[facility] PROGRESS NOTE  Steven Bell ATF:[id number] DOB: [birth year] DOA: 02/07/2014 PCP: [name], MD  Assessment/Plan: 1. Acute ITP: Admitted for IV IG and IV steroids. Hematology consulted and recommendations given. No platelet transfusion unless starts actively bleeding.   SIRS with fever and leukocytosis: On broad spectrum antibiotics.    Acute on CKD : Unclear etiology. Holding lisinopril and HCTZ. Will add hydralazine prn for BP control.  Pt reports getting US renal on Monday. We couldn't get hold of the report because of the weekend and the [facility] was closed.  UA showed hematuria, probably from the ITP. Cultures sent, repeat US renal ordered, urine electrolytes ordered and renal consulted for recommendations. H/o prostate hypertrophy.    Anemia: Drop from baseline hemoglobin of 10.5 . Stool for occult blood and anemia panel ordered.    Leukocytosis: Probably reactive. Vs SIRS .    Hyponatremia: ? Medications. Resolved.  Stopped HCTZ. Getting SIADH work up . He doesn't appear to be dehydrated.     DVT prophylaxis.   Code Status: full code.  Family Communication: none atbedside Disposition Plan: pending.    Consultants:  Hematology.  Nephrology.   Procedures:  none  Antibiotics:  IV vancomycin  IV zosyn.   HPI/Subjective: Mild hematuria. And frequent urination.  No nausea, vomiting.no sob, chest pain or headache or blurry vision.   Objective: Filed Vitals:   02/10/14 1612  BP: 138/69  Pulse: 58  Temp: 98.4 F (36.9 C)  Resp: 18    Intake/Output Summary (Last 24 hours) at 02/10/14 1759 Last data filed at 02/10/14 2706  Gross per 24 hour  Intake    480 ml  Output      0 ml  Net    480 ml   Filed Weights   02/07/14 2145 02/07/14 2225  Weight: 71.2 kg (156 lb 15.5 oz) 69.627 kg (153 lb 8 oz)    Exam:   General:  Alert afebrile comfortable  Cardiovascular: s1s2   Respiratory: ctah  Abdomen: soft non  tender non distended bowel sounds heard  Musculoskeletal: petechia over the extremities and the rest of the body. Right shoulder and right wrist swelling and tenderness.   Data Reviewed: Basic Metabolic Panel:  Recent Labs Lab 02/07/14 1708 02/08/14 0425 02/09/14 0535 02/10/14 0514  NA 139 128* 129* 136  K 4.2 4.0 3.8 3.7  CL 105 100 101 107  CO2 22 20 22 23   GLUCOSE 115* 195* 143* 142*  BUN 29* 29* 54* 63*  CREATININE 1.69* 1.81* 2.20* 2.09*  CALCIUM 9.6 8.4 8.3* 8.4  PHOS  --   --   --  4.8*   Liver Function Tests:  Recent Labs Lab 02/07/14 1708 02/10/14 0514  AST 23  --   ALT 14  --   ALKPHOS 62  --   BILITOT 0.9  --   PROT 9.0*  --   ALBUMIN 4.0 2.6*   No results for input(s): LIPASE, AMYLASE in the last 168 hours. No results for input(s): AMMONIA in the last 168 hours. CBC:  Recent Labs Lab 02/07/14 1834 02/08/14 0425 02/09/14 0535 02/10/14 0514  WBC 16.9* 17.2* 18.5* 16.0*  NEUTROABS 12.9*  --  16.3* 14.1*  HGB 10.7* 10.6* 8.8* 9.4*  HCT 33.5* 32.3* 26.7* 28.3*  MCV 92.3 91.5 89.9 90.1  PLT <5* <5* <5* <5*   Cardiac Enzymes: No results for input(s): CKTOTAL, CKMB, CKMBINDEX, TROPONINI in the last 168 hours. BNP (last 3 results) No results for input(s): PROBNP in  the last 8760 hours. CBG: No results for input(s): GLUCAP in the last 168 hours.  Recent Results (from the past 240 hour(s))  Blood culture (routine x 2)     Status: None (Preliminary result)   Collection Time: 02/07/14  5:08 PM  Result Value Ref Range Status   Specimen Description BLOOD LEFT HAND  Final   Special Requests BOTTLES DRAWN AEROBIC AND ANAEROBIC 5 CC  Final   Culture   Final           BLOOD CULTURE RECEIVED NO GROWTH TO DATE CULTURE WILL BE HELD FOR 5 DAYS BEFORE ISSUING A FINAL NEGATIVE REPORT Performed at [facility]    Report Status PENDING  Incomplete  Blood culture (routine x 2)     Status: None (Preliminary result)   Collection Time: 02/07/14  5:26 PM   Result Value Ref Range Status   Specimen Description BLOOD RIGHT ANTECUBITAL  Final   Special Requests BOTTLES DRAWN AEROBIC AND ANAEROBIC 3 CC  Final   Culture   Final           BLOOD CULTURE RECEIVED NO GROWTH TO DATE CULTURE WILL BE HELD FOR 5 DAYS BEFORE ISSUING A FINAL NEGATIVE REPORT Performed at [facility]    Report Status PENDING  Incomplete     Studies: No results found.  Scheduled Meds: . atorvastatin  10 mg Oral Daily  . dexamethasone  8 mg Intravenous Q24H  . metoprolol succinate  100 mg Oral Daily  . multivitamin with minerals  1 tablet Oral Daily  . piperacillin-tazobactam (ZOSYN)  IV  3.375 g Intravenous Q8H  . sodium chloride  3 mL Intravenous Q12H  . tamsulosin  0.4 mg Oral QHS  . vancomycin  750 mg Intravenous Q24H   Continuous Infusions:   Principal Problem:   Acute ITP Active Problems:   SIRS (systemic inflammatory response syndrome)   Renal impairment   Swelling of joint of right wrist   Acute renal failure    Time spent: 15 minutes    [facility] Pager [phone]. If 7PM-7AM, please contact night-coverage at [email or web address], password [facility] 02/10/2014, 5:59 PM  LOS: 3 days

## 2014-02-11 DIAGNOSIS — R972 Elevated prostate specific antigen [PSA]: Secondary | ICD-10-CM

## 2014-02-11 DIAGNOSIS — I10 Essential (primary) hypertension: Secondary | ICD-10-CM

## 2014-02-11 LAB — CBC WITH DIFFERENTIAL/PLATELET
BASOS PCT: 0 % (ref 0–1)
Basophils Absolute: 0 10*3/uL (ref 0.0–0.1)
EOS PCT: 0 % (ref 0–5)
Eosinophils Absolute: 0 10*3/uL (ref 0.0–0.7)
HCT: 30.1 % — ABNORMAL LOW (ref 39.0–52.0)
HEMOGLOBIN: 9.9 g/dL — AB (ref 13.0–17.0)
LYMPHS PCT: 22 % (ref 12–46)
Lymphs Abs: 2.6 10*3/uL (ref 0.7–4.0)
MCH: 29.6 pg (ref 26.0–34.0)
MCHC: 32.9 g/dL (ref 30.0–36.0)
MCV: 90.1 fL (ref 78.0–100.0)
MONOS PCT: 10 % (ref 3–12)
Monocytes Absolute: 1.2 10*3/uL — ABNORMAL HIGH (ref 0.1–1.0)
NEUTROS PCT: 68 % (ref 43–77)
Neutro Abs: 7.9 10*3/uL — ABNORMAL HIGH (ref 1.7–7.7)
PLATELETS: 12 10*3/uL — AB (ref 150–400)
RBC: 3.34 MIL/uL — AB (ref 4.22–5.81)
RDW: 12.7 % (ref 11.5–15.5)
WBC: 11.7 10*3/uL — AB (ref 4.0–10.5)

## 2014-02-11 LAB — URINE CULTURE
Colony Count: NO GROWTH
Culture: NO GROWTH

## 2014-02-11 LAB — RENAL FUNCTION PANEL
ANION GAP: 4 — AB (ref 5–15)
Albumin: 2.8 g/dL — ABNORMAL LOW (ref 3.5–5.2)
BUN: 59 mg/dL — ABNORMAL HIGH (ref 6–23)
CALCIUM: 8.4 mg/dL (ref 8.4–10.5)
CHLORIDE: 111 meq/L (ref 96–112)
CO2: 22 mmol/L (ref 19–32)
Creatinine, Ser: 1.84 mg/dL — ABNORMAL HIGH (ref 0.50–1.35)
GFR calc Af Amer: 41 mL/min — ABNORMAL LOW (ref >90)
GFR, EST NON AFRICAN AMERICAN: 36 mL/min — AB (ref >90)
Glucose, Bld: 104 mg/dL — ABNORMAL HIGH (ref 70–99)
POTASSIUM: 3.6 mmol/L (ref 3.5–5.1)
Phosphorus: 3.6 mg/dL (ref 2.3–4.6)
SODIUM: 137 mmol/L (ref 135–145)

## 2014-02-11 LAB — MPO/PR-3 (ANCA) ANTIBODIES
Myeloperoxidase Abs: <1
Serine Protease 3: <1

## 2014-02-11 MED ORDER — PREDNISONE 50 MG PO TABS
80.0000 mg | ORAL_TABLET | Freq: Every day | ORAL | Status: DC
  Administered 2014-02-11: 80 mg via ORAL
  Filled 2014-02-11 (×2): qty 1

## 2014-02-11 NOTE — Progress Notes (Signed)
Patient ID: Steven Bell, male   DOB: August 25, 1945, 68 y.o.   MRN: [id number]  [facility] Progress Note   Assessment/ Plan:   1. Acute and/or chronic renal failure - hx HTN, +nsaid/acei/diuretics, L hydro, high PVR; prob underlying obstructive nephropathy w superimposed AKI from vol depletion and meds.  Would get urology involved given L hydro and high PVR. Continue IVF's. (*Renal US report is in shadow chart) 2. Hyponatremia: resolved.  3. ITP: Management with corticosteroids and IVIG per hematology-platelet count unchanged at less than 5 4. Anemia: No ongoing overt loss-may have had some soft tissue hemorrhage. Iron stores appear to be replete with elevated folic acid and O97 levels. 5. Hypertension: Continue to monitor off HCTZ/lisinopril. Blood pressures well controlled on metoprolol and tamsulosin. 6. Possible sepsis: Urine cultures and blood cultures are negative to date-on broad-spectrum antimicrobial coverage with vancomycin and Zosyn. Should be stopped soon per primary notes.  Kelly Splinter MD (pgr) [phone]    [phone] 02/11/2014, 4:25 PM     Subjective:   Feels ok, no complaints.    Objective:   BP 120/63 mmHg  Pulse 54  Temp(Src) 98.3 F (36.8 C) (Oral)  Resp 18  Wt 69.627 kg (153 lb 8 oz)  SpO2 98%  Intake/Output Summary (Last 24 hours) at 02/11/14 1604 Last data filed at 02/11/14 0930  Gross per 24 hour  Intake    480 ml  Output      0 ml  Net    480 ml   Weight change:   Physical Exam: Gen: Comfortably resting in bed CVS: Regular in rate and rhythm, S1 and S2 normal Resp: Clear to auscultation, no rales/rhonchi Abd: Soft, obese, nontender and bowel sounds are normal Ext: No lower extremity edema-petechial rash over ankles/legs  Imaging: Out[facility] renal US [facility] 02/04/14 [facility] >  R kidney 9.3 cm, no hydro, possible 3 mm R renal calculus vs cortical calcification L kidney 11.1 cm, mild-mod hydro Bladder debris in bladder,  thickened bladder wall and 359 mL pos-void bladder residual  Labs: BMET  Recent Labs Lab 02/07/14 1708 02/08/14 0425 02/09/14 0535 02/10/14 0514 02/11/14 0530  NA 139 128* 129* 136 137  K 4.2 4.0 3.8 3.7 3.6  CL 105 100 101 107 111  CO2 22 20 22 23 22   GLUCOSE 115* 195* 143* 142* 104*  BUN 29* 29* 54* 63* 59*  CREATININE 1.69* 1.81* 2.20* 2.09* 1.84*  CALCIUM 9.6 8.4 8.3* 8.4 8.4  PHOS  --   --   --  4.8* 3.6   CBC  Recent Labs Lab 02/07/14 1834 02/08/14 0425 02/09/14 0535 02/10/14 0514 02/11/14 0530  WBC 16.9* 17.2* 18.5* 16.0* 11.7*  NEUTROABS 12.9*  --  16.3* 14.1* 7.9*  HGB 10.7* 10.6* 8.8* 9.4* 9.9*  HCT 33.5* 32.3* 26.7* 28.3* 30.1*  MCV 92.3 91.5 89.9 90.1 90.1  PLT <5* <5* <5* <5* 12*    Medications:    . atorvastatin  10 mg Oral Daily  . metoprolol succinate  100 mg Oral Daily  . multivitamin with minerals  1 tablet Oral Daily  . piperacillin-tazobactam (ZOSYN)  IV  3.375 g Intravenous Q8H  . predniSONE  80 mg Oral Daily  . sodium chloride  3 mL Intravenous Q12H  . tamsulosin  0.4 mg Oral QHS  . vancomycin  750 mg Intravenous Q24H   Elmarie Shiley, MD 02/11/2014, 4:04 PM

## 2014-02-11 NOTE — Progress Notes (Signed)
[facility] PROGRESS NOTE  Steven Bell YQM:[id number] DOB: 27-May-1945 DOA: 02/07/2014 PCP: [name], MD  Assessment/Plan: 1. Acute ITP: Admitted for IV IG and IV steroids. Hematology consulted and recommendations given. No platelet transfusion unless starts actively bleeding. Platelets started improving.   SIRS with fever and leukocytosis: URINE Cultures are negative. And blood cultures negative so far. He has completed 5 days of broad spectrum antibiotics.  Will stop antibiotics in am if the cultures are negative.    Acute on CKD : Probably from left hydronephrosis.  Holding lisinopril and HCTZ. Will add hydralazine prn for BP control.  Pt reports getting US renal on Monday. UA showed hematuria, . Cultures negative, renal consulted for recommendations. . Patient reports he has an appointment with Dr [name] on Jan 6th for prostate hypertrophy and hydronephrosis.    Anemia: Stable around 9. Continue to monitor. No signs of bleeding.    Leukocytosis: improving .  Probably reactive. Vs SIRS .    Hyponatremia: ? Medications. Resolved.  Stopped HCTZ.     DVT prophylaxis.   Code Status: full code.  Family Communication: none atbedside Disposition Plan: pending.    Consultants:  Hematology.  Nephrology.   Procedures:  none  Antibiotics:  IV vancomycin  IV zosyn.   HPI/Subjective: Hematuria resolved.restless and wants to go home.   No nausea, vomiting.no sob, chest pain or headache or blurry vision.   Objective: Filed Vitals:   02/11/14 0537  BP: 130/66  Pulse: 56  Temp: 97.6 F (36.4 C)  Resp: 18    Intake/Output Summary (Last 24 hours) at 02/11/14 1358 Last data filed at 02/11/14 0930  Gross per 24 hour  Intake    480 ml  Output      0 ml  Net    480 ml   Filed Weights   02/07/14 2145 02/07/14 2225  Weight: 71.2 kg (156 lb 15.5 oz) 69.627 kg (153 lb 8 oz)    Exam:   General:  Alert afebrile comfortable  Cardiovascular:  s1s2   Respiratory: ctah  Abdomen: soft non tender non distended bowel sounds heard  Musculoskeletal: petechia over the extremities and the rest of the body. Right shoulder and right wrist swelling and tenderness improved.    Data Reviewed: Basic Metabolic Panel:  Recent Labs Lab 02/07/14 1708 02/08/14 0425 02/09/14 0535 02/10/14 0514 02/11/14 0530  NA 139 128* 129* 136 137  K 4.2 4.0 3.8 3.7 3.6  CL 105 100 101 107 111  CO2 22 20 22 23 22   GLUCOSE 115* 195* 143* 142* 104*  BUN 29* 29* 54* 63* 59*  CREATININE 1.69* 1.81* 2.20* 2.09* 1.84*  CALCIUM 9.6 8.4 8.3* 8.4 8.4  PHOS  --   --   --  4.8* 3.6   Liver Function Tests:  Recent Labs Lab 02/07/14 1708 02/10/14 0514 02/11/14 0530  AST 23  --   --   ALT 14  --   --   ALKPHOS 62  --   --   BILITOT 0.9  --   --   PROT 9.0*  --   --   ALBUMIN 4.0 2.6* 2.8*   No results for input(s): LIPASE, AMYLASE in the last 168 hours. No results for input(s): AMMONIA in the last 168 hours. CBC:  Recent Labs Lab 02/07/14 1834 02/08/14 0425 02/09/14 0535 02/10/14 0514 02/11/14 0530  WBC 16.9* 17.2* 18.5* 16.0* 11.7*  NEUTROABS 12.9*  --  16.3* 14.1* 7.9*  HGB 10.7* 10.6* 8.8* 9.4* 9.9*  HCT 33.5* 32.3* 26.7* 28.3* 30.1*  MCV 92.3 91.5 89.9 90.1 90.1  PLT <5* <5* <5* <5* 12*   Cardiac Enzymes: No results for input(s): CKTOTAL, CKMB, CKMBINDEX, TROPONINI in the last 168 hours. BNP (last 3 results) No results for input(s): PROBNP in the last 8760 hours. CBG: No results for input(s): GLUCAP in the last 168 hours.  Recent Results (from the past 240 hour(s))  Blood culture (routine x 2)     Status: None (Preliminary result)   Collection Time: 02/07/14  5:08 PM  Result Value Ref Range Status   Specimen Description BLOOD LEFT HAND  Final   Special Requests BOTTLES DRAWN AEROBIC AND ANAEROBIC 5 CC  Final   Culture   Final           BLOOD CULTURE RECEIVED NO GROWTH TO DATE CULTURE WILL BE HELD FOR 5 DAYS BEFORE ISSUING A  FINAL NEGATIVE REPORT Performed at [facility]    Report Status PENDING  Incomplete  Blood culture (routine x 2)     Status: None (Preliminary result)   Collection Time: 02/07/14  5:26 PM  Result Value Ref Range Status   Specimen Description BLOOD RIGHT ANTECUBITAL  Final   Special Requests BOTTLES DRAWN AEROBIC AND ANAEROBIC 3 CC  Final   Culture   Final           BLOOD CULTURE RECEIVED NO GROWTH TO DATE CULTURE WILL BE HELD FOR 5 DAYS BEFORE ISSUING A FINAL NEGATIVE REPORT Performed at [facility]    Report Status PENDING  Incomplete  Culture, Urine     Status: None   Collection Time: 02/09/14  4:00 PM  Result Value Ref Range Status   Specimen Description URINE, RANDOM  Final   Special Requests NONE  Final   Colony Count NO GROWTH Performed at [facility]   Final   Culture NO GROWTH Performed at [facility]   Final   Report Status 02/11/2014 FINAL  Final     Studies: No results found.  Scheduled Meds: . atorvastatin  10 mg Oral Daily  . dexamethasone  8 mg Intravenous Q24H  . metoprolol succinate  100 mg Oral Daily  . multivitamin with minerals  1 tablet Oral Daily  . piperacillin-tazobactam (ZOSYN)  IV  3.375 g Intravenous Q8H  . sodium chloride  3 mL Intravenous Q12H  . tamsulosin  0.4 mg Oral QHS  . vancomycin  750 mg Intravenous Q24H   Continuous Infusions:   Principal Problem:   Acute ITP Active Problems:   SIRS (systemic inflammatory response syndrome)   Renal impairment   Swelling of joint of right wrist   Acute renal failure    Time spent: 15 minutes    [facility] Pager [phone]. If 7PM-7AM, please contact night-coverage at [email or web address], password [facility] 02/11/2014, 1:58 PM  LOS: 4 days

## 2014-02-11 NOTE — Progress Notes (Signed)
IP PROGRESS NOTE  Subjective:   He reports feeling better. The right wrist and right shoulder or less painful. He continues to have bruising. No other bleeding.  Objective: Vital signs in last 24 hours: Blood pressure 130/66, pulse 56, temperature 97.6 F (36.4 C), temperature source Oral, resp. rate 18, weight 153 lb 8 oz (69.627 kg), SpO2 100 %.  Intake/Output from previous day: 12/27 0701 - 12/28 0700 In: 480 [P.O.:480] Out: -   Physical Exam:  HEENT: Oral cavity without bleeding. Lungs: Clear bilaterally Cardiac: Regular rate and rhythm Abdomen: No hepatosplenomegaly Extremities: No leg edema Skin: Petechial rash over the legs. Small ecchymosis at the left upper arm. Resolving ecchymosis at the right shoulder Musculoskeletal: No edema at the right wrist.    Lab Results:  Recent Labs  02/10/14 0514 02/11/14 0530  WBC 16.0* 11.7*  HGB 9.4* 9.9*  HCT 28.3* 30.1*  PLT <5* 12*    BMET  Recent Labs  02/10/14 0514 02/11/14 0530  NA 136 137  K 3.7 3.6  CL 107 111  CO2 23 22  GLUCOSE 142* 104*  BUN 63* 59*  CREATININE 2.09* 1.84*  CALCIUM 8.4 8.4    Studies/Results: No results found.  Medications: I have reviewed the patient's current medications.  Assessment/Plan:  1. Severe thrombocytopenia, improved today, likely secondary to ITP  Status post IVIG 02/08/2014 and 02/09/2014  Steroids initiated 02/08/2014, switched to Decadron 02/10/2014  2. Renal insufficiency 3. Hypertension 4. Anemia-most likely secondary to bleeding, improved 5. Elevated PSA 6. Fever on hospital admission-potentially related to a urinary tract infection, cultures negative to date  The thrombocytopenia is improved today. I suspect he has ITP as opposed to severe thrombocytopenia related to infection. He did not have evidence of severe sepsis or DIC on hospital admission.  I will switch him to an oral steroid regimen.  Recommendations: 1. Begin daily prednisone 2.  Continue workup of the renal insufficiency and prostatic hypertrophy per nephrology and urology   LOS: 4 days   [facility], [place]  02/11/2014, 2:02 PM

## 2014-02-12 ENCOUNTER — Other Ambulatory Visit: Payer: Self-pay | Admitting: *Deleted

## 2014-02-12 DIAGNOSIS — D693 Immune thrombocytopenic purpura: Secondary | ICD-10-CM

## 2014-02-12 LAB — CBC
HEMATOCRIT: 34.4 % — AB (ref 39.0–52.0)
Hemoglobin: 10.8 g/dL — ABNORMAL LOW (ref 13.0–17.0)
MCH: 28.9 pg (ref 26.0–34.0)
MCHC: 31.4 g/dL (ref 30.0–36.0)
MCV: 92 fL (ref 78.0–100.0)
PLATELETS: 52 10*3/uL — AB (ref 150–400)
RBC: 3.74 MIL/uL — ABNORMAL LOW (ref 4.22–5.81)
RDW: 12.8 % (ref 11.5–15.5)
WBC: 10 10*3/uL (ref 4.0–10.5)

## 2014-02-12 LAB — RENAL FUNCTION PANEL
Albumin: 2.9 g/dL — ABNORMAL LOW (ref 3.5–5.2)
Anion gap: 7 (ref 5–15)
BUN: 49 mg/dL — AB (ref 6–23)
CO2: 25 mmol/L (ref 19–32)
CREATININE: 1.71 mg/dL — AB (ref 0.50–1.35)
Calcium: 8.6 mg/dL (ref 8.4–10.5)
Chloride: 107 mEq/L (ref 96–112)
GFR calc Af Amer: 45 mL/min — ABNORMAL LOW (ref >90)
GFR, EST NON AFRICAN AMERICAN: 39 mL/min — AB (ref >90)
Glucose, Bld: 154 mg/dL — ABNORMAL HIGH (ref 70–99)
PHOSPHORUS: 4.2 mg/dL (ref 2.3–4.6)
Potassium: 4 mmol/L (ref 3.5–5.1)
Sodium: 139 mmol/L (ref 135–145)

## 2014-02-12 LAB — ANA: ANA: POSITIVE — AB

## 2014-02-12 LAB — ANTI-NUCLEAR AB-TITER (ANA TITER): ANA Titer 1: 1:40 {titer} — ABNORMAL HIGH

## 2014-02-12 MED ORDER — PREDNISONE 50 MG PO TABS
60.0000 mg | ORAL_TABLET | Freq: Every day | ORAL | Status: DC
  Administered 2014-02-12: 60 mg via ORAL
  Filled 2014-02-12: qty 1

## 2014-02-12 MED ORDER — PREDNISONE 20 MG PO TABS: 60.0000 mg | ORAL_TABLET | Freq: Every day | ORAL | Status: DC

## 2014-02-12 NOTE — Progress Notes (Signed)
IP PROGRESS NOTE  Subjective:   No new complaint  Objective: Vital signs in last 24 hours: Blood pressure 120/63, pulse 64, temperature 98.4 F (36.9 C), temperature source Oral, resp. rate 16, height 5\' 4"  (1.626 m), weight 147 lb 8 oz (66.906 kg), SpO2 99 %.  Intake/Output from previous day: 12/28 0701 - 12/29 0700 In: 1890 [P.O.:840; IV Piggyback:1050] Out: 700 [Urine:700]  Physical Exam:  HEENT: Oral cavity without bleeding.  Skin: Petechial rash over the legs. Resolving ecchymosis at the right shoulder Musculoskeletal: No edema at the right wrist.    Lab Results:  Recent Labs  02/11/14 0530 02/12/14 0500  WBC 11.7* 10.0  HGB 9.9* 10.8*  HCT 30.1* 34.4*  PLT 12* 52*    BMET  Recent Labs  02/11/14 0530 02/12/14 0514  NA 137 139  K 3.6 4.0  CL 111 107  CO2 22 25  GLUCOSE 104* 154*  BUN 59* 49*  CREATININE 1.84* 1.71*  CALCIUM 8.4 8.6    Studies/Results: No results found.  Medications: I have reviewed the patient's current medications.  Assessment/Plan:  1. Severe thrombocytopenia, improved today, likely secondary to ITP  Status post IVIG 02/08/2014 and 02/09/2014  Steroids initiated 02/08/2014, Decadron 02/10/2014  Started prednisone 02/11/2014  2. Renal insufficiency 3. Hypertension 4. Anemia-most likely secondary to bleeding, improved 5. Elevated PSA 6. Fever on hospital admission-potentially related to a urinary tract infection, cultures negative to date  The thrombocytopenia is again improved today. He appears stable for discharge from a hematology standpoint.   Recommendations: 1. Continue prednisone at a dose of 60 mg daily 2. Continue workup of the renal insufficiency and prostatic hypertrophy per nephrology and urology 3. [facility] with the plan to complete a slow outpatient prednisone taper   LOS: 5 days   [place]  02/12/2014, 8:38 AM

## 2014-02-12 NOTE — Discharge Summary (Signed)
Physician Discharge Summary  Steven Bell EXH:[id number] DOB: 1946-01-04 DOA: 02/07/2014  PCP: [name], MD  Admit date: 02/07/2014 Discharge date: 02/12/2014  Time spent: 30 minutes  Recommendations for Outpatient Follow-up:  1. Follow up with PCP in one week.  Follow up with oncology as recommended.  Follow up with urology as recommended.  Please check platelet count as recommended.   Discharge Diagnoses:  Principal Problem:   Acute ITP Active Problems:   SIRS (systemic inflammatory response syndrome)   Renal impairment   Swelling of joint of right wrist   Acute renal failure   Discharge Condition: improved.   Diet recommendation: low sodium diet.   Filed Weights   02/07/14 2145 02/07/14 2225 02/12/14 0530  Weight: 71.2 kg (156 lb 15.5 oz) 69.627 kg (153 lb 8 oz) 66.906 kg (147 lb 8 oz)    History of present illness:  Steven Bell is a 68 y.o. male who presented to his PCPs office today after he developed atraumatic, painful, right wrist and right shoulder swelling onset 3 days ago. He was found to have severe thrombocytopenia probably seocndary to acute ITP. He was also found to be in acute on CKD.   Hospital Course:  1. Acute ITP: Admitted for IV IG and IV steroids. Hematology consulted and recommendations given. He completed 3 days of IV IG and steroids. His platelets are improving and he was discharged to follow up with hematology as recommended.   SIRS with fever and leukocytosis: URINE Cultures are negative. And blood cultures negative so far. He has completed 5 days of broad spectrum antibiotics.    Acute on CKD : Probably from left hydronephrosis. Holding lisinopril and HCTZ. UA showed hematuria, . Cultures negative, renal consulted for recommendations. . Patient reports he has an appointment with Dr [name] on Jan 6th for prostate hypertrophy and hydronephrosis.    Anemia: Stable around 9. Continue to monitor. No signs of bleeding.     Leukocytosis: improving .  Probably reactive. Vs SIRS .    Hyponatremia: ? Medications. Resolved.  Stopped HCTZ.     Procedures:  CT abd and pelvis.   Consultations:  Hematology  Renal   Discharge Exam: Filed Vitals:   02/12/14 0530  BP: 120/63  Pulse: 64  Temp: 98.4 F (36.9 C)  Resp: 16   General: alert afebrile comfortable Cardiovascular: s1s2 Respiratory: ctab  Discharge Instructions   Discharge Instructions    Diet - low sodium heart healthy    Complete by:  As directed      Discharge instructions    Complete by:  As directed   Follow up with Dr Learta Codding as recommended on Monday.  Follow up with Dr [name] on Jan 6 th as scheduled.  Please check CBC AND BMP Monday.          Current Discharge Medication List    START taking these medications   Details  predniSONE (DELTASONE) 20 MG tablet Take 3 tablets (60 mg total) by mouth daily. Qty: 10 tablet, Refills: 0   Associated Diagnoses: Acute ITP      CONTINUE these medications which have NOT CHANGED   Details  acetaminophen (TYLENOL) 500 MG tablet Take 500-1,000 mg by mouth every 6 (six) hours as needed for moderate pain.    atorvastatin (LIPITOR) 10 MG tablet Take 10 mg by mouth daily.    metoprolol succinate (TOPROL-XL) 100 MG 24 hr tablet Take 100 mg by mouth daily. Take with or immediately following a meal.    Multiple  Vitamin (MULTIVITAMIN WITH MINERALS) TABS tablet Take 1 tablet by mouth daily.    tamsulosin (FLOMAX) 0.4 MG CAPS capsule Take 0.4 mg by mouth at bedtime.      STOP taking these medications     hydrochlorothiazide (HYDRODIURIL) 25 MG tablet      lisinopril (PRINIVIL,ZESTRIL) 20 MG tablet      aspirin EC 81 MG tablet        No Known Allergies Follow-up Information    Follow up with [name], MD. Schedule an appointment as soon as possible for a visit in 2 weeks.   Specialty:  Family Medicine   Contact information:   [address]  [id number] [phone]       Follow up with [name], MD.   Specialty:  Oncology   Contact information:   [place] [id number] [phone]        The results of significant diagnostics from this hospitalization (including imaging, microbiology, ancillary and laboratory) are listed below for reference.    Significant Diagnostic Studies: No results found.  Microbiology: Recent Results (from the past 240 hour(s))  Blood culture (routine x 2)     Status: None (Preliminary result)   Collection Time: 02/07/14  5:08 PM  Result Value Ref Range Status   Specimen Description BLOOD LEFT HAND  Final   Special Requests BOTTLES DRAWN AEROBIC AND ANAEROBIC 5 CC  Final   Culture   Final           BLOOD CULTURE RECEIVED NO GROWTH TO DATE CULTURE WILL BE HELD FOR 5 DAYS BEFORE ISSUING A FINAL NEGATIVE REPORT Performed at [facility]    Report Status PENDING  Incomplete  Blood culture (routine x 2)     Status: None (Preliminary result)   Collection Time: 02/07/14  5:26 PM  Result Value Ref Range Status   Specimen Description BLOOD RIGHT ANTECUBITAL  Final   Special Requests BOTTLES DRAWN AEROBIC AND ANAEROBIC 3 CC  Final   Culture   Final           BLOOD CULTURE RECEIVED NO GROWTH TO DATE CULTURE WILL BE HELD FOR 5 DAYS BEFORE ISSUING A FINAL NEGATIVE REPORT Performed at [facility]    Report Status PENDING  Incomplete  Culture, Urine     Status: None   Collection Time: 02/09/14  4:00 PM  Result Value Ref Range Status   Specimen Description URINE, RANDOM  Final   Special Requests NONE  Final   Colony Count NO GROWTH Performed at [facility]   Final   Culture NO GROWTH Performed at [facility]   Final   Report Status 02/11/2014 FINAL  Final     Labs: Basic Metabolic Panel:  Recent Labs Lab 02/08/14 0425 02/09/14 0535 02/10/14 0514 02/11/14 0530 02/12/14 0514  NA 128* 129* 136 137 139  K 4.0 3.8 3.7 3.6 4.0  CL  100 101 107 111 107  CO2 20 22 23 22 25   GLUCOSE 195* 143* 142* 104* 154*  BUN 29* 54* 63* 59* 49*  CREATININE 1.81* 2.20* 2.09* 1.84* 1.71*  CALCIUM 8.4 8.3* 8.4 8.4 8.6  PHOS  --   --  4.8* 3.6 4.2   Liver Function Tests:  Recent Labs Lab 02/07/14 1708 02/10/14 0514 02/11/14 0530 02/12/14 0514  AST 23  --   --   --   ALT 14  --   --   --   Arabella Merles  62  --   --   --   BILITOT 0.9  --   --   --   PROT 9.0*  --   --   --   ALBUMIN 4.0 2.6* 2.8* 2.9*   No results for input(s): LIPASE, AMYLASE in the last 168 hours. No results for input(s): AMMONIA in the last 168 hours. CBC:  Recent Labs Lab 02/07/14 1834 02/08/14 0425 02/09/14 0535 02/10/14 0514 02/11/14 0530 02/12/14 0500  WBC 16.9* 17.2* 18.5* 16.0* 11.7* 10.0  NEUTROABS 12.9*  --  16.3* 14.1* 7.9*  --   HGB 10.7* 10.6* 8.8* 9.4* 9.9* 10.8*  HCT 33.5* 32.3* 26.7* 28.3* 30.1* 34.4*  MCV 92.3 91.5 89.9 90.1 90.1 92.0  PLT <5* <5* <5* <5* 12* 52*   Cardiac Enzymes: No results for input(s): CKTOTAL, CKMB, CKMBINDEX, TROPONINI in the last 168 hours. BNP: BNP (last 3 results) No results for input(s): PROBNP in the last 8760 hours. CBG: No results for input(s): GLUCAP in the last 168 hours.     SignedHosie Poisson  [facility] 02/12/2014, 11:00 AM

## 2014-02-13 LAB — CULTURE, BLOOD (ROUTINE X 2)
Culture: NO GROWTH
Culture: NO GROWTH

## 2014-02-13 LAB — HCV RNA QUANT: HCV Quantitative: NOT DETECTED IU/mL — ABNORMAL LOW (ref <15)

## 2014-02-18 ENCOUNTER — Other Ambulatory Visit (HOSPITAL_BASED_OUTPATIENT_CLINIC_OR_DEPARTMENT_OTHER): Payer: Medicare Other

## 2014-02-18 ENCOUNTER — Encounter: Payer: Self-pay | Admitting: Internal Medicine

## 2014-02-18 DIAGNOSIS — D693 Immune thrombocytopenic purpura: Secondary | ICD-10-CM | POA: Diagnosis not present

## 2014-02-18 LAB — CBC WITH DIFFERENTIAL/PLATELET
BASO%: 0.1 % (ref 0.0–2.0)
Basophils Absolute: 0 10*3/uL (ref 0.0–0.1)
EOS%: 0.9 % (ref 0.0–7.0)
Eosinophils Absolute: 0.1 10*3/uL (ref 0.0–0.5)
HCT: 33.9 % — ABNORMAL LOW (ref 38.4–49.9)
HEMOGLOBIN: 10.8 g/dL — AB (ref 13.0–17.1)
LYMPH#: 2.7 10*3/uL (ref 0.9–3.3)
LYMPH%: 19.9 % (ref 14.0–49.0)
MCH: 29.6 pg (ref 27.2–33.4)
MCHC: 31.9 g/dL — AB (ref 32.0–36.0)
MCV: 92.9 fL (ref 79.3–98.0)
MONO#: 1.2 10*3/uL — AB (ref 0.1–0.9)
MONO%: 8.7 % (ref 0.0–14.0)
NEUT#: 9.6 10*3/uL — ABNORMAL HIGH (ref 1.5–6.5)
NEUT%: 70.4 % (ref 39.0–75.0)
Platelets: 274 10*3/uL (ref 140–400)
RBC: 3.65 10*6/uL — ABNORMAL LOW (ref 4.20–5.82)
RDW: 14.2 % (ref 11.0–14.6)
WBC: 13.7 10*3/uL — AB (ref 4.0–10.3)

## 2014-02-18 LAB — TECHNOLOGIST REVIEW

## 2014-02-19 ENCOUNTER — Telehealth: Payer: Self-pay | Admitting: *Deleted

## 2014-02-19 DIAGNOSIS — D693 Immune thrombocytopenic purpura: Secondary | ICD-10-CM

## 2014-02-19 MED ORDER — PREDNISONE 20 MG PO TABS: 40.0000 mg | ORAL_TABLET | Freq: Every day | ORAL | Status: DC

## 2014-02-19 NOTE — Telephone Encounter (Signed)
-----   Message from Ladell Pier, MD sent at 02/18/2014  9:29 PM EST ----- Please call patient, decrease prednisone to 40mg  daily

## 2014-02-19 NOTE — Telephone Encounter (Signed)
Notified that platelet count is now normal. Script for Prednisone was for #10 pills, so he took only 20 mg on Saturday and has been out since. Instructed him MD wants him on 40 mg daily and to follow up next week as scheduled. Need to taper the steroid and not just stop it. He understands and agrees.

## 2014-02-27 ENCOUNTER — Other Ambulatory Visit (HOSPITAL_BASED_OUTPATIENT_CLINIC_OR_DEPARTMENT_OTHER): Payer: PRIVATE HEALTH INSURANCE

## 2014-02-27 ENCOUNTER — Ambulatory Visit (HOSPITAL_BASED_OUTPATIENT_CLINIC_OR_DEPARTMENT_OTHER): Payer: PRIVATE HEALTH INSURANCE | Admitting: Nurse Practitioner

## 2014-02-27 ENCOUNTER — Telehealth: Payer: Self-pay | Admitting: Oncology

## 2014-02-27 VITALS — BP 122/64 | HR 69 | Temp 98.1°F | Resp 18 | Ht 64.0 in | Wt 157.2 lb

## 2014-02-27 DIAGNOSIS — D649 Anemia, unspecified: Secondary | ICD-10-CM | POA: Diagnosis not present

## 2014-02-27 DIAGNOSIS — N400 Enlarged prostate without lower urinary tract symptoms: Secondary | ICD-10-CM | POA: Diagnosis not present

## 2014-02-27 DIAGNOSIS — D696 Thrombocytopenia, unspecified: Secondary | ICD-10-CM

## 2014-02-27 DIAGNOSIS — R972 Elevated prostate specific antigen [PSA]: Secondary | ICD-10-CM

## 2014-02-27 DIAGNOSIS — D693 Immune thrombocytopenic purpura: Secondary | ICD-10-CM

## 2014-02-27 DIAGNOSIS — N289 Disorder of kidney and ureter, unspecified: Secondary | ICD-10-CM

## 2014-02-27 DIAGNOSIS — I10 Essential (primary) hypertension: Secondary | ICD-10-CM | POA: Diagnosis not present

## 2014-02-27 LAB — CBC WITH DIFFERENTIAL/PLATELET
BASO%: 0 % (ref 0.0–2.0)
Basophils Absolute: 0 10*3/uL (ref 0.0–0.1)
EOS%: 0.2 % (ref 0.0–7.0)
Eosinophils Absolute: 0 10*3/uL (ref 0.0–0.5)
HEMATOCRIT: 35.9 % — AB (ref 38.4–49.9)
HGB: 11.2 g/dL — ABNORMAL LOW (ref 13.0–17.1)
LYMPH%: 15.9 % (ref 14.0–49.0)
MCH: 29.5 pg (ref 27.2–33.4)
MCHC: 31.2 g/dL — AB (ref 32.0–36.0)
MCV: 94.5 fL (ref 79.3–98.0)
MONO#: 0.6 10*3/uL (ref 0.1–0.9)
MONO%: 4.7 % (ref 0.0–14.0)
NEUT#: 10.1 10*3/uL — ABNORMAL HIGH (ref 1.5–6.5)
NEUT%: 79.2 % — AB (ref 39.0–75.0)
Platelets: 248 10*3/uL (ref 140–400)
RBC: 3.8 10*6/uL — ABNORMAL LOW (ref 4.20–5.82)
RDW: 14.8 % — ABNORMAL HIGH (ref 11.0–14.6)
WBC: 12.7 10*3/uL — ABNORMAL HIGH (ref 4.0–10.3)
lymph#: 2 10*3/uL (ref 0.9–3.3)

## 2014-02-27 NOTE — Progress Notes (Signed)
  [place] OFFICE PROGRESS NOTE   Diagnosis:  Thrombocytopenia likely secondary to ITP  INTERVAL HISTORY:   Steven Bell returns as scheduled. Dr. Benay Spice saw him in consult during a recent hospitalization with severe thrombocytopenia. He received IVIG on 02/08/2014 and 02/09/2014. Steroids were initiated on 02/08/2014. The platelet count improved. He was discharged home on 02/12/2014.  Follow-up CBC on 02/18/2014 returned with a platelet count of 274,000. Prednisone dose was decreased to 40 mg daily.  He reports the bruises are resolving. No new bruises. He denies any bleeding. He is following up with urology regarding an enlarged prostate. He recently began Proscar. He is self catheterizing 2-3 times a day. He denies any leg swelling. No mouth sores. He notes improvement in his appetite.  Objective:  Vital signs in last 24 hours:  Blood pressure 122/64, pulse 69, temperature 98.1 F (36.7 C), temperature source Oral, resp. rate 18, height 5\' 4"  (1.626 m), weight 157 lb 3.2 oz (71.305 kg), SpO2 100 %.    HEENT: No bruising or bleeding within the visualized oral cavity. Resp: Lungs clear bilaterally. Cardio: Regular rate and rhythm. GI: Abdomen soft and nontender. No organomegaly. Vascular: No leg edema. Skin: Resolving ecchymoses at the left lower and upper arm.    Lab Results:  Lab Results  Component Value Date   WBC 12.7* 02/27/2014   HGB 11.2* 02/27/2014   HCT 35.9* 02/27/2014   MCV 94.5 02/27/2014   PLT 248 02/27/2014   NEUTROABS 10.1* 02/27/2014    Imaging:  No results found.  Medications: I have reviewed the patient's current medications.  Assessment/Plan: 1. Severe thrombocytopenia, improved today, likely secondary to ITP  Status post IVIG 02/08/2014 and 02/09/2014  Steroids initiated 02/08/2014, Decadron 02/10/2014  Started prednisone 02/11/2014  Prednisone decreased to 40 mg daily beginning 02/18/2014  Prednisone degrees to 30 mg  daily beginning 02/27/2014  2. Renal insufficiency 3. Hypertension 4. Anemia-most likely secondary to bleeding, improved 5. Elevated PSA 6. Fever on hospital admission-potentially related to a urinary tract infection, cultures negative. 7. Prostatic hypertrophy. He continues follow-up with urology.    Disposition: Steven Bell platelet count remains in normal range. We instructed him to decrease the prednisone dose from 40 mg daily to 30 mg daily. He will return in 2 weeks for a CBC and in 4 weeks for a follow-up visit. He will contact the office in the interim with any problems. We specifically discussed bruising/bleeding.  Plan reviewed with Dr. Benay Spice.    Ned Card ANP/GNP-BC   02/27/2014  10:31 AM

## 2014-02-27 NOTE — Telephone Encounter (Signed)
gv and printed appt sched and avs fo rpt for Jan adn Feb..Marland Kitchen

## 2014-03-13 ENCOUNTER — Other Ambulatory Visit (HOSPITAL_BASED_OUTPATIENT_CLINIC_OR_DEPARTMENT_OTHER): Payer: PRIVATE HEALTH INSURANCE

## 2014-03-13 DIAGNOSIS — D696 Thrombocytopenia, unspecified: Secondary | ICD-10-CM

## 2014-03-13 DIAGNOSIS — D693 Immune thrombocytopenic purpura: Secondary | ICD-10-CM

## 2014-03-13 LAB — CBC WITH DIFFERENTIAL/PLATELET
BASO%: 1.1 % (ref 0.0–2.0)
Basophils Absolute: 0.1 10*3/uL (ref 0.0–0.1)
EOS%: 0.9 % (ref 0.0–7.0)
Eosinophils Absolute: 0.1 10*3/uL (ref 0.0–0.5)
HCT: 37.3 % — ABNORMAL LOW (ref 38.4–49.9)
HEMOGLOBIN: 11.8 g/dL — AB (ref 13.0–17.1)
LYMPH%: 35.1 % (ref 14.0–49.0)
MCH: 29.5 pg (ref 27.2–33.4)
MCHC: 31.7 g/dL — ABNORMAL LOW (ref 32.0–36.0)
MCV: 93.2 fL (ref 79.3–98.0)
MONO#: 0.6 10*3/uL (ref 0.1–0.9)
MONO%: 5.4 % (ref 0.0–14.0)
NEUT#: 6.7 10*3/uL — ABNORMAL HIGH (ref 1.5–6.5)
NEUT%: 57.5 % (ref 39.0–75.0)
PLATELETS: 223 10*3/uL (ref 140–400)
RBC: 4 10*6/uL — ABNORMAL LOW (ref 4.20–5.82)
RDW: 15.5 % — AB (ref 11.0–14.6)
WBC: 11.7 10*3/uL — AB (ref 4.0–10.3)
lymph#: 4.1 10*3/uL — ABNORMAL HIGH (ref 0.9–3.3)

## 2014-03-14 ENCOUNTER — Telehealth: Payer: Self-pay

## 2014-03-14 NOTE — Telephone Encounter (Signed)
Called and informed pt platelet count still in normal range, to decrease pred to 20mg  a day and to follow up as scheduled. Pt verbalized understanding.

## 2014-03-14 NOTE — Telephone Encounter (Signed)
-----   Message from Owens Shark, NP sent at 03/14/2014  3:49 PM EST ----- Please let him know the platelet count remains in normal range. He should decrease prednisone to 20 mg daily. Keep scheduled follow-up visit.

## 2014-03-26 ENCOUNTER — Other Ambulatory Visit (HOSPITAL_BASED_OUTPATIENT_CLINIC_OR_DEPARTMENT_OTHER): Payer: PRIVATE HEALTH INSURANCE

## 2014-03-26 ENCOUNTER — Encounter: Payer: Self-pay | Admitting: *Deleted

## 2014-03-26 ENCOUNTER — Ambulatory Visit (HOSPITAL_BASED_OUTPATIENT_CLINIC_OR_DEPARTMENT_OTHER): Payer: PRIVATE HEALTH INSURANCE | Admitting: Oncology

## 2014-03-26 ENCOUNTER — Telehealth: Payer: Self-pay | Admitting: Oncology

## 2014-03-26 VITALS — BP 127/64 | HR 71 | Temp 98.3°F | Resp 18 | Ht 64.0 in | Wt 162.2 lb

## 2014-03-26 DIAGNOSIS — N289 Disorder of kidney and ureter, unspecified: Secondary | ICD-10-CM | POA: Diagnosis not present

## 2014-03-26 DIAGNOSIS — D696 Thrombocytopenia, unspecified: Secondary | ICD-10-CM

## 2014-03-26 DIAGNOSIS — D693 Immune thrombocytopenic purpura: Secondary | ICD-10-CM

## 2014-03-26 DIAGNOSIS — R972 Elevated prostate specific antigen [PSA]: Secondary | ICD-10-CM | POA: Diagnosis not present

## 2014-03-26 DIAGNOSIS — D649 Anemia, unspecified: Secondary | ICD-10-CM

## 2014-03-26 LAB — CBC WITH DIFFERENTIAL/PLATELET
BASO%: 0.3 % (ref 0.0–2.0)
BASOS ABS: 0 10*3/uL (ref 0.0–0.1)
EOS%: 0.5 % (ref 0.0–7.0)
Eosinophils Absolute: 0.1 10*3/uL (ref 0.0–0.5)
HCT: 39.1 % (ref 38.4–49.9)
HEMOGLOBIN: 12.6 g/dL — AB (ref 13.0–17.1)
LYMPH#: 5.1 10*3/uL — AB (ref 0.9–3.3)
LYMPH%: 40.5 % (ref 14.0–49.0)
MCH: 30.5 pg (ref 27.2–33.4)
MCHC: 32.2 g/dL (ref 32.0–36.0)
MCV: 94.7 fL (ref 79.3–98.0)
MONO#: 1 10*3/uL — ABNORMAL HIGH (ref 0.1–0.9)
MONO%: 8 % (ref 0.0–14.0)
NEUT#: 6.4 10*3/uL (ref 1.5–6.5)
NEUT%: 50.7 % (ref 39.0–75.0)
Platelets: 231 10*3/uL (ref 140–400)
RBC: 4.13 10*6/uL — AB (ref 4.20–5.82)
RDW: 14.7 % — AB (ref 11.0–14.6)
WBC: 12.6 10*3/uL — ABNORMAL HIGH (ref 4.0–10.3)

## 2014-03-26 LAB — TECHNOLOGIST REVIEW

## 2014-03-26 NOTE — Progress Notes (Signed)
  [place] OFFICE PROGRESS NOTE   Diagnosis: ITP  INTERVAL HISTORY:   He returns as scheduled. He is currently maintained on prednisone at a dose of 20 mg daily. He reports an increased appetite. Mr. Lenderman continues to have urinary retention. He performs self-catheterization 3 times per day. He has been evaluated at the urology Center and has a follow-up appointment scheduled there.  Objective:  Vital signs in last 24 hours:  Blood pressure 127/64, pulse 71, temperature 98.3 F (36.8 C), temperature source Oral, resp. rate 18, height 5\' 4"  (1.626 m), weight 162 lb 3.2 oz (73.573 kg), SpO2 100 %.    HEENT: No thrush Resp: Lungs clear bilaterally Cardio: Regular rate and rhythm GI: No hepatosplenomegaly Vascular: No leg edema   Lab Results:  Lab Results  Component Value Date   WBC 12.6* 03/26/2014   HGB 12.6* 03/26/2014   HCT 39.1 03/26/2014   MCV 94.7 03/26/2014   PLT 231 03/26/2014   NEUTROABS 6.4 03/26/2014    Medications: I have reviewed the patient's current medications.  Assessment/Plan: 1. Severe thrombocytopenia, improved today, likely secondary to ITP  Status post IVIG 02/08/2014 and 02/09/2014  Steroids initiated 02/08/2014, Decadron 02/10/2014  Started prednisone 02/11/2014  Prednisone decreased to 40 mg daily beginning 02/18/2014  Prednisone decreased to 30 mg daily beginning 02/27/2014  Prednisone decreased to 20 mg daily beginning 03/14/2014  Prednisone decreased to 10 mg daily beginning 03/26/2014  2. Renal insufficiency 3. Hypertension 4. Anemia-most likely secondary to bleeding, improved 5. Elevated PSA 6. Fever on hospital admission December 2015-potentially related to a urinary tract infection, cultures negative. 7. Prostatic hypertrophy/urinary retention. He continues self-catheterization and is followed by urology   Disposition:  The platelet count remains in the normal range. He will continue a prednisone taper.  The prednisone was decreased to 10 mg daily beginning today. He will return for a CBC in 3 weeks and an office visit in 6 weeks.  [name], MD  03/26/2014  9:38 AM

## 2014-03-26 NOTE — Telephone Encounter (Signed)
gv and printed appt sched anda vs for pt for March.... °

## 2014-04-16 ENCOUNTER — Other Ambulatory Visit (HOSPITAL_BASED_OUTPATIENT_CLINIC_OR_DEPARTMENT_OTHER): Payer: PRIVATE HEALTH INSURANCE

## 2014-04-16 DIAGNOSIS — D693 Immune thrombocytopenic purpura: Secondary | ICD-10-CM | POA: Diagnosis not present

## 2014-04-16 DIAGNOSIS — N289 Disorder of kidney and ureter, unspecified: Secondary | ICD-10-CM

## 2014-04-16 LAB — BASIC METABOLIC PANEL (CC13)
Anion Gap: 15 mEq/L — ABNORMAL HIGH (ref 3–11)
BUN: 28.2 mg/dL — AB (ref 7.0–26.0)
CO2: 22 mEq/L (ref 22–29)
CREATININE: 1.8 mg/dL — AB (ref 0.7–1.3)
Calcium: 9 mg/dL (ref 8.4–10.4)
Chloride: 106 mEq/L (ref 98–109)
EGFR: 38 mL/min/{1.73_m2} — AB (ref >90)
GLUCOSE: 192 mg/dL — AB (ref 70–140)
POTASSIUM: 3.6 meq/L (ref 3.5–5.1)
Sodium: 142 mEq/L (ref 136–145)

## 2014-04-16 LAB — CBC WITH DIFFERENTIAL/PLATELET
BASO%: 1.1 % (ref 0.0–2.0)
BASOS ABS: 0.1 10*3/uL (ref 0.0–0.1)
EOS ABS: 0.3 10*3/uL (ref 0.0–0.5)
EOS%: 2.9 % (ref 0.0–7.0)
HEMATOCRIT: 35 % — AB (ref 38.4–49.9)
HEMOGLOBIN: 11.6 g/dL — AB (ref 13.0–17.1)
LYMPH#: 2.3 10*3/uL (ref 0.9–3.3)
LYMPH%: 24.7 % (ref 14.0–49.0)
MCH: 30.2 pg (ref 27.2–33.4)
MCHC: 33.1 g/dL (ref 32.0–36.0)
MCV: 91.2 fL (ref 79.3–98.0)
MONO#: 0.7 10*3/uL (ref 0.1–0.9)
MONO%: 7.1 % (ref 0.0–14.0)
NEUT%: 64.2 % (ref 39.0–75.0)
NEUTROS ABS: 5.9 10*3/uL (ref 1.5–6.5)
Platelets: 219 10*3/uL (ref 140–400)
RBC: 3.84 10*6/uL — ABNORMAL LOW (ref 4.20–5.82)
RDW: 15.6 % — ABNORMAL HIGH (ref 11.0–14.6)
WBC: 9.2 10*3/uL (ref 4.0–10.3)

## 2014-04-17 ENCOUNTER — Telehealth: Payer: Self-pay | Admitting: *Deleted

## 2014-04-17 NOTE — Telephone Encounter (Signed)
Left voice message for pt to call re: lab results / med adjustment and need name of pt urologist.   Routed labs to Dr. Sabra Heck.

## 2014-04-17 NOTE — Telephone Encounter (Signed)
-----   Message from Ladell Pier, MD sent at 04/16/2014  1:31 PM EST ----- Please call patient, platelets remain normal, decrease prednisone to 5mg  daily Creatinine still high, copy lab to Dr. Sabra Heck and urologist

## 2014-04-18 ENCOUNTER — Encounter: Payer: Self-pay | Admitting: Internal Medicine

## 2014-04-25 ENCOUNTER — Telehealth: Payer: Self-pay | Admitting: *Deleted

## 2014-04-25 NOTE — Telephone Encounter (Signed)
Called pt with instructions to decrease Prednisone to 5 mg daily. He reports he has follow up at [facility] next week.

## 2014-05-07 ENCOUNTER — Other Ambulatory Visit (HOSPITAL_BASED_OUTPATIENT_CLINIC_OR_DEPARTMENT_OTHER): Payer: PRIVATE HEALTH INSURANCE

## 2014-05-07 ENCOUNTER — Ambulatory Visit (HOSPITAL_BASED_OUTPATIENT_CLINIC_OR_DEPARTMENT_OTHER): Payer: PRIVATE HEALTH INSURANCE | Admitting: Nurse Practitioner

## 2014-05-07 ENCOUNTER — Telehealth: Payer: Self-pay | Admitting: Nurse Practitioner

## 2014-05-07 VITALS — BP 127/61 | HR 73 | Temp 98.1°F | Resp 20 | Ht 64.0 in | Wt 169.1 lb

## 2014-05-07 DIAGNOSIS — D693 Immune thrombocytopenic purpura: Secondary | ICD-10-CM

## 2014-05-07 DIAGNOSIS — N289 Disorder of kidney and ureter, unspecified: Secondary | ICD-10-CM

## 2014-05-07 DIAGNOSIS — R972 Elevated prostate specific antigen [PSA]: Secondary | ICD-10-CM | POA: Diagnosis not present

## 2014-05-07 DIAGNOSIS — D649 Anemia, unspecified: Secondary | ICD-10-CM

## 2014-05-07 LAB — CBC WITH DIFFERENTIAL/PLATELET
BASO%: 0.4 % (ref 0.0–2.0)
BASOS ABS: 0 10*3/uL (ref 0.0–0.1)
EOS ABS: 0.2 10*3/uL (ref 0.0–0.5)
EOS%: 2.6 % (ref 0.0–7.0)
HEMATOCRIT: 37.4 % — AB (ref 38.4–49.9)
HEMOGLOBIN: 12.1 g/dL — AB (ref 13.0–17.1)
LYMPH%: 41.4 % (ref 14.0–49.0)
MCH: 30.1 pg (ref 27.2–33.4)
MCHC: 32.4 g/dL (ref 32.0–36.0)
MCV: 93 fL (ref 79.3–98.0)
MONO#: 0.6 10*3/uL (ref 0.1–0.9)
MONO%: 7 % (ref 0.0–14.0)
NEUT%: 48.6 % (ref 39.0–75.0)
NEUTROS ABS: 4.5 10*3/uL (ref 1.5–6.5)
PLATELETS: 282 10*3/uL (ref 140–400)
RBC: 4.02 10*6/uL — ABNORMAL LOW (ref 4.20–5.82)
RDW: 14.5 % (ref 11.0–14.6)
WBC: 9.2 10*3/uL (ref 4.0–10.3)
lymph#: 3.8 10*3/uL — ABNORMAL HIGH (ref 0.9–3.3)

## 2014-05-07 MED ORDER — PREDNISONE 5 MG PO TABS: 5.0000 mg | ORAL_TABLET | Freq: Every day | ORAL | Status: DC

## 2014-05-07 NOTE — Progress Notes (Signed)
  [place] OFFICE PROGRESS NOTE   Diagnosis:  ITP  INTERVAL HISTORY:   [name] returns as scheduled. Prednisone was tapered to 5 mg daily beginning 04/25/2014. He denies any bleeding. He continues to gain weight. He attributes the weight gain to an increased appetite related to the steroids. He continues to have urinary retention. He performs self-catheterization 3-4 times a day. He continues follow-up at the urology Center.  Objective:  Vital signs in last 24 hours:  Blood pressure 127/61, pulse 73, temperature 98.1 F (36.7 C), temperature source Oral, resp. rate 20, height 5\' 4"  (1.626 m), weight 169 lb 1.6 oz (76.703 kg), SpO2 100 %.    HEENT: No thrush. Resp: Lungs clear bilaterally. Cardio: Regular rate and rhythm. GI: Abdomen soft and nontender. No organomegaly. Vascular: No leg edema.   Lab Results:  Lab Results  Component Value Date   WBC 9.2 05/07/2014   HGB 12.1* 05/07/2014   HCT 37.4* 05/07/2014   MCV 93.0 05/07/2014   PLT 282 05/07/2014   NEUTROABS 4.5 05/07/2014    Imaging:  No results found.  Medications: I have reviewed the patient's current medications.  Assessment/Plan: 1. Severe thrombocytopenia, likely secondary to ITP  Status post IVIG 02/08/2014 and 02/09/2014  Steroids initiated 02/08/2014, Decadron 02/10/2014  Started prednisone 02/11/2014  Prednisone decreased to 40 mg daily beginning 02/18/2014  Prednisone decreased to 30 mg daily beginning 02/27/2014  Prednisone decreased to 20 mg daily beginning 03/14/2014  Prednisone decreased to 10 mg daily beginning 03/26/2014  Prednisone decreased to 5 mg daily beginning 04/25/2014  2. Renal insufficiency 3. Hypertension 4. Anemia-most likely secondary to bleeding, improved 5. Elevated PSA 6. Fever on hospital admission December 2015-potentially related to a urinary tract infection, cultures negative. 7. Prostatic hypertrophy/urinary retention. He continues  self-catheterization and is followed by urology   Disposition: The platelet count remains in normal range. He will continue prednisone 5 mg daily. We will obtain a repeat CBC in 2 weeks. If the platelet count is in normal range at that time, prednisone will be tapered to 2.5 mg daily. We will see him in follow-up in 6 weeks. He will contact the office in the interim with any problems.  Plan reviewed with Dr. Benay Spice.    Ned Card ANP/GNP-BC   05/07/2014  9:41 AM

## 2014-05-07 NOTE — Telephone Encounter (Signed)
Gave avs & calendar for April/May. °

## 2014-05-21 ENCOUNTER — Telehealth: Payer: Self-pay | Admitting: *Deleted

## 2014-05-21 ENCOUNTER — Other Ambulatory Visit (HOSPITAL_BASED_OUTPATIENT_CLINIC_OR_DEPARTMENT_OTHER): Payer: PRIVATE HEALTH INSURANCE

## 2014-05-21 DIAGNOSIS — D693 Immune thrombocytopenic purpura: Secondary | ICD-10-CM

## 2014-05-21 DIAGNOSIS — D649 Anemia, unspecified: Secondary | ICD-10-CM | POA: Diagnosis not present

## 2014-05-21 DIAGNOSIS — R972 Elevated prostate specific antigen [PSA]: Secondary | ICD-10-CM

## 2014-05-21 DIAGNOSIS — N289 Disorder of kidney and ureter, unspecified: Secondary | ICD-10-CM | POA: Diagnosis not present

## 2014-05-21 LAB — CBC WITH DIFFERENTIAL/PLATELET
BASO%: 0.5 % (ref 0.0–2.0)
BASOS ABS: 0 10*3/uL (ref 0.0–0.1)
EOS ABS: 0.2 10*3/uL (ref 0.0–0.5)
EOS%: 1.9 % (ref 0.0–7.0)
HEMATOCRIT: 37.2 % — AB (ref 38.4–49.9)
HGB: 12.1 g/dL — ABNORMAL LOW (ref 13.0–17.1)
LYMPH#: 2.8 10*3/uL (ref 0.9–3.3)
LYMPH%: 33.6 % (ref 14.0–49.0)
MCH: 30.3 pg (ref 27.2–33.4)
MCHC: 32.5 g/dL (ref 32.0–36.0)
MCV: 93.2 fL (ref 79.3–98.0)
MONO#: 0.7 10*3/uL (ref 0.1–0.9)
MONO%: 8.9 % (ref 0.0–14.0)
NEUT#: 4.6 10*3/uL (ref 1.5–6.5)
NEUT%: 55.1 % (ref 39.0–75.0)
Platelets: 218 10*3/uL (ref 140–400)
RBC: 3.99 10*6/uL — AB (ref 4.20–5.82)
RDW: 14.4 % (ref 11.0–14.6)
WBC: 8.3 10*3/uL (ref 4.0–10.3)

## 2014-05-21 NOTE — Telephone Encounter (Signed)
-----   Message from Owens Shark, NP sent at 05/21/2014 10:00 AM EDT ----- Please let him know the platelet count remains in normal range and give instructions to decrease prednisone to 2.5 mg daily.

## 2014-05-21 NOTE — Telephone Encounter (Signed)
Called and informed patient that platelet count remains in the normal range and to decrease prednisone to 2.5mg  daily.  Per Elby Showers. Marcello Moores, NP.  Patient verbalized understanding.

## 2014-06-18 ENCOUNTER — Other Ambulatory Visit (HOSPITAL_BASED_OUTPATIENT_CLINIC_OR_DEPARTMENT_OTHER): Payer: PRIVATE HEALTH INSURANCE

## 2014-06-18 ENCOUNTER — Telehealth: Payer: Self-pay | Admitting: Oncology

## 2014-06-18 ENCOUNTER — Other Ambulatory Visit: Payer: Self-pay | Admitting: *Deleted

## 2014-06-18 ENCOUNTER — Ambulatory Visit (HOSPITAL_BASED_OUTPATIENT_CLINIC_OR_DEPARTMENT_OTHER): Payer: PRIVATE HEALTH INSURANCE | Admitting: Oncology

## 2014-06-18 VITALS — BP 113/57 | HR 66 | Temp 97.9°F | Resp 20 | Ht 64.0 in | Wt 169.6 lb

## 2014-06-18 DIAGNOSIS — D649 Anemia, unspecified: Secondary | ICD-10-CM

## 2014-06-18 DIAGNOSIS — D693 Immune thrombocytopenic purpura: Secondary | ICD-10-CM

## 2014-06-18 DIAGNOSIS — R339 Retention of urine, unspecified: Secondary | ICD-10-CM | POA: Diagnosis not present

## 2014-06-18 DIAGNOSIS — N289 Disorder of kidney and ureter, unspecified: Secondary | ICD-10-CM | POA: Diagnosis not present

## 2014-06-18 DIAGNOSIS — D696 Thrombocytopenia, unspecified: Secondary | ICD-10-CM

## 2014-06-18 LAB — CBC WITH DIFFERENTIAL/PLATELET
BASO%: 0.4 % (ref 0.0–2.0)
Basophils Absolute: 0 10*3/uL (ref 0.0–0.1)
EOS%: 2.8 % (ref 0.0–7.0)
Eosinophils Absolute: 0.2 10*3/uL (ref 0.0–0.5)
HEMATOCRIT: 36.9 % — AB (ref 38.4–49.9)
HGB: 12.2 g/dL — ABNORMAL LOW (ref 13.0–17.1)
LYMPH%: 46.7 % (ref 14.0–49.0)
MCH: 30.7 pg (ref 27.2–33.4)
MCHC: 33.1 g/dL (ref 32.0–36.0)
MCV: 92.7 fL (ref 79.3–98.0)
MONO#: 0.8 10*3/uL (ref 0.1–0.9)
MONO%: 10.4 % (ref 0.0–14.0)
NEUT#: 3.2 10*3/uL (ref 1.5–6.5)
NEUT%: 39.7 % (ref 39.0–75.0)
Platelets: 218 10*3/uL (ref 140–400)
RBC: 3.98 10*6/uL — ABNORMAL LOW (ref 4.20–5.82)
RDW: 13 % (ref 11.0–14.6)
WBC: 8.1 10*3/uL (ref 4.0–10.3)
lymph#: 3.8 10*3/uL — ABNORMAL HIGH (ref 0.9–3.3)

## 2014-06-18 NOTE — Telephone Encounter (Signed)
Gave patient avs report and appointments for July and September  °

## 2014-06-18 NOTE — Progress Notes (Signed)
  [facility] OFFICE PROGRESS NOTE   Diagnosis: ITP  INTERVAL HISTORY:   Steven Bell returns as scheduled. He has been maintained on prednisone at a dose of 2.5 mg daily since 05/21/2014. He has noted weight gain while on prednisone. He continues self-catheterization for the bladder outlet obstruction. He is followed by urology.  Objective:  Vital signs in last 24 hours:  Blood pressure 113/57, pulse 66, temperature 97.9 F (36.6 C), temperature source Oral, resp. rate 20, height 5\' 4"  (1.626 m), weight 169 lb 9.6 oz (76.93 kg), SpO2 100 %.    HEENT: Thrush, mild cushingoid change at the face and neck Lymphatics: No cervical or supraclavicular nodes Resp: Lungs clear bilaterally Cardio: Regular rate and rhythm GI: No hepatosplenomegaly Vascular: No leg edema   Lab Results:  Lab Results  Component Value Date   WBC 8.1 06/18/2014   HGB 12.2* 06/18/2014   HCT 36.9* 06/18/2014   MCV 92.7 06/18/2014   PLT 218 06/18/2014   NEUTROABS 3.2 06/18/2014    Medications: I have reviewed the patient's current medications.  Assessment/Plan:  1. Severe thrombocytopenia, likely secondary to ITP  Status post IVIG 02/08/2014 and 02/09/2014  Steroids initiated 02/08/2014, Decadron 02/10/2014  Started prednisone 02/11/2014  Prednisone decreased to 40 mg daily beginning 02/18/2014  Prednisone decreased to 30 mg daily beginning 02/27/2014  Prednisone decreased to 20 mg daily beginning 03/14/2014  Prednisone decreased to 10 mg daily beginning 03/26/2014  Prednisone decreased to 5 mg daily beginning 04/25/2014  Prednisone decreased to 2.5 mg daily on 05/21/2014  Prednisone taper to 2.5 mg every other day for 5 doses and then stop beginning on 06/18/2014  2. Renal insufficiency 3. Hypertension 4. Anemia-most likely secondary to bleeding, improved 5. Elevated PSA 6. Fever on hospital admission December 2015-potentially related to a urinary tract infection, cultures  negative. 7. Prostatic hypertrophy/urinary retention. He continues self-catheterization and is followed by urology   Disposition:  Steven Bell remains in remission from the ITP. He will take prednisone at a dose of 2.5 mg every other day for 5 doses and then stop. He will return for a CBC in 2 months and an office visit in 4 months. He will contact us for spontaneous bleeding or bruising.  He continues follow-up at the urology Center for management of the elevated PSA and urinary retention.  [name], MD  06/18/2014  2:25 PM

## 2014-07-26 DIAGNOSIS — Z862 Personal history of diseases of the blood and blood-forming organs and certain disorders involving the immune mechanism: Secondary | ICD-10-CM | POA: Diagnosis not present

## 2014-07-26 DIAGNOSIS — N183 Chronic kidney disease, stage 3 (moderate): Secondary | ICD-10-CM | POA: Diagnosis not present

## 2014-07-26 DIAGNOSIS — N400 Enlarged prostate without lower urinary tract symptoms: Secondary | ICD-10-CM | POA: Diagnosis not present

## 2014-07-26 DIAGNOSIS — E785 Hyperlipidemia, unspecified: Secondary | ICD-10-CM | POA: Diagnosis not present

## 2014-07-26 DIAGNOSIS — I10 Essential (primary) hypertension: Secondary | ICD-10-CM | POA: Diagnosis not present

## 2014-08-22 ENCOUNTER — Other Ambulatory Visit (HOSPITAL_BASED_OUTPATIENT_CLINIC_OR_DEPARTMENT_OTHER): Payer: PRIVATE HEALTH INSURANCE

## 2014-08-22 DIAGNOSIS — D693 Immune thrombocytopenic purpura: Secondary | ICD-10-CM

## 2014-08-22 DIAGNOSIS — D696 Thrombocytopenia, unspecified: Secondary | ICD-10-CM

## 2014-08-22 LAB — CBC WITH DIFFERENTIAL/PLATELET
BASO%: 0.4 % (ref 0.0–2.0)
BASOS ABS: 0 10*3/uL (ref 0.0–0.1)
EOS%: 3.2 % (ref 0.0–7.0)
Eosinophils Absolute: 0.2 10*3/uL (ref 0.0–0.5)
HCT: 37 % — ABNORMAL LOW (ref 38.4–49.9)
HEMOGLOBIN: 12.1 g/dL — AB (ref 13.0–17.1)
LYMPH%: 36.1 % (ref 14.0–49.0)
MCH: 29.8 pg (ref 27.2–33.4)
MCHC: 32.7 g/dL (ref 32.0–36.0)
MCV: 91.1 fL (ref 79.3–98.0)
MONO#: 0.5 10*3/uL (ref 0.1–0.9)
MONO%: 7.3 % (ref 0.0–14.0)
NEUT#: 3.9 10*3/uL (ref 1.5–6.5)
NEUT%: 53 % (ref 39.0–75.0)
Platelets: 206 10*3/uL (ref 140–400)
RBC: 4.06 10*6/uL — ABNORMAL LOW (ref 4.20–5.82)
RDW: 13.2 % (ref 11.0–14.6)
WBC: 7.3 10*3/uL (ref 4.0–10.3)
lymph#: 2.6 10*3/uL (ref 0.9–3.3)

## 2014-08-23 ENCOUNTER — Telehealth: Payer: Self-pay | Admitting: *Deleted

## 2014-08-23 NOTE — Telephone Encounter (Signed)
-----   Message from Ladell Pier, MD sent at 08/22/2014  8:06 PM EDT ----- Please call patient, platelets stable, f/u as scheduled

## 2014-08-23 NOTE — Telephone Encounter (Signed)
Per Dr. Benay Spice; notified pt that platelets are stable, f/u as scheduled.  Pt verbalized understanding and expressed appreciation for call.

## 2014-10-24 ENCOUNTER — Telehealth: Payer: Self-pay | Admitting: Nurse Practitioner

## 2014-10-24 ENCOUNTER — Ambulatory Visit (HOSPITAL_BASED_OUTPATIENT_CLINIC_OR_DEPARTMENT_OTHER): Payer: Medicare Other | Admitting: Nurse Practitioner

## 2014-10-24 ENCOUNTER — Other Ambulatory Visit (HOSPITAL_BASED_OUTPATIENT_CLINIC_OR_DEPARTMENT_OTHER): Payer: PRIVATE HEALTH INSURANCE

## 2014-10-24 VITALS — BP 130/63 | HR 84 | Temp 98.4°F | Resp 18 | Ht 64.0 in | Wt 167.8 lb

## 2014-10-24 DIAGNOSIS — I10 Essential (primary) hypertension: Secondary | ICD-10-CM | POA: Diagnosis not present

## 2014-10-24 DIAGNOSIS — D696 Thrombocytopenia, unspecified: Secondary | ICD-10-CM

## 2014-10-24 DIAGNOSIS — N289 Disorder of kidney and ureter, unspecified: Secondary | ICD-10-CM | POA: Diagnosis not present

## 2014-10-24 DIAGNOSIS — D693 Immune thrombocytopenic purpura: Secondary | ICD-10-CM

## 2014-10-24 LAB — CBC WITH DIFFERENTIAL/PLATELET
BASO%: 0.9 % (ref 0.0–2.0)
BASOS ABS: 0.1 10*3/uL (ref 0.0–0.1)
EOS ABS: 0.5 10*3/uL (ref 0.0–0.5)
EOS%: 3.8 % (ref 0.0–7.0)
HEMATOCRIT: 34.7 % — AB (ref 38.4–49.9)
HEMOGLOBIN: 11.6 g/dL — AB (ref 13.0–17.1)
LYMPH#: 3.5 10*3/uL — AB (ref 0.9–3.3)
LYMPH%: 29.3 % (ref 14.0–49.0)
MCH: 29.3 pg (ref 27.2–33.4)
MCHC: 33.4 g/dL (ref 32.0–36.0)
MCV: 87.7 fL (ref 79.3–98.0)
MONO#: 1.3 10*3/uL — AB (ref 0.1–0.9)
MONO%: 10.4 % (ref 0.0–14.0)
NEUT#: 6.7 10*3/uL — ABNORMAL HIGH (ref 1.5–6.5)
NEUT%: 55.6 % (ref 39.0–75.0)
PLATELETS: 291 10*3/uL (ref 140–400)
RBC: 3.96 10*6/uL — ABNORMAL LOW (ref 4.20–5.82)
RDW: 13.7 % (ref 11.0–14.6)
WBC: 12 10*3/uL — ABNORMAL HIGH (ref 4.0–10.3)

## 2014-10-24 NOTE — Telephone Encounter (Signed)
per pof to sch pt appt-gave pt copy of avs °

## 2014-10-24 NOTE — Progress Notes (Signed)
  [place] OFFICE PROGRESS NOTE   Diagnosis: ITP   INTERVAL HISTORY:   Steven Bell returns as scheduled. He completed the prednisone taper in May of this year. He denies bleeding or unusual bruising. He continues self-catheterization for the bladder of obstruction. He is following up with urology.  Objective:  Vital signs in last 24 hours:  Blood pressure 130/63, pulse 84, temperature 98.4 F (36.9 C), temperature source Oral, resp. rate 18, height 5\' 4"  (1.626 m), weight 167 lb 12.8 oz (76.114 kg), SpO2 100 %.    HEENT: No thrush or ulcers. Lymphatics: No palpable cervical or supra-clavicular lymph nodes. Resp: Lungs clear bilaterally. Cardio: Regular rate and rhythm. GI: Abdomen soft and nontender. No organomegaly. Vascular: No leg edema.   Lab Results:  Lab Results  Component Value Date   WBC 12.0* 10/24/2014   HGB 11.6* 10/24/2014   HCT 34.7* 10/24/2014   MCV 87.7 10/24/2014   PLT 291 10/24/2014   NEUTROABS 6.7* 10/24/2014    Imaging:  No results found.  Medications: I have reviewed the patient's current medications.  Assessment/Plan: 1. Severe thrombocytopenia, likely secondary to ITP  Status post IVIG 02/08/2014 and 02/09/2014  Steroids initiated 02/08/2014, Decadron 02/10/2014  Started prednisone 02/11/2014  Prednisone decreased to 40 mg daily beginning 02/18/2014  Prednisone decreased to 30 mg daily beginning 02/27/2014  Prednisone decreased to 20 mg daily beginning 03/14/2014  Prednisone decreased to 10 mg daily beginning 03/26/2014  Prednisone decreased to 5 mg daily beginning 04/25/2014  Prednisone decreased to 2.5 mg daily on 05/21/2014  Prednisone taper to 2.5 mg every other day for 5 doses and then stop beginning on 06/18/2014  2. Renal insufficiency 3. Hypertension 4. Anemia-most likely secondary to bleeding, improved 5. Elevated PSA 6. Fever on hospital admission December 2015-potentially related to a urinary tract  infection, cultures negative. 7. Prostatic hypertrophy/urinary retention. He continues self-catheterization and is followed by urology   Disposition: [name] appears stable. He completed the prednisone taper in May of this year. The platelet count remains in normal range. Hemoglobin is down slightly as compared to last visit. He will return for a follow-up CBC in 6 weeks. He will return for a follow-up visit in 3-4 months.    Ned Card ANP/GNP-BC   10/24/2014  1:51 PM

## 2014-10-25 ENCOUNTER — Telehealth: Payer: Self-pay | Admitting: *Deleted

## 2014-10-25 NOTE — Telephone Encounter (Signed)
Pt made aware of CBC results from 9/8. Appreciated call and knows when to follow up w/our office.

## 2014-10-25 NOTE — Telephone Encounter (Signed)
-----   Message from Ladell Pier, MD sent at 10/25/2014  7:24 AM EDT ----- Please call patient, platelets normal, hb mildly low, f/u as scheduled

## 2014-11-08 DIAGNOSIS — R972 Elevated prostate specific antigen [PSA]: Secondary | ICD-10-CM | POA: Diagnosis not present

## 2014-12-05 ENCOUNTER — Other Ambulatory Visit: Payer: PRIVATE HEALTH INSURANCE

## 2014-12-05 ENCOUNTER — Other Ambulatory Visit: Payer: Self-pay | Admitting: *Deleted

## 2014-12-05 NOTE — Progress Notes (Signed)
Pt did not show for lab appointment today. Order sent to schedulers to contact pt with new appointment.

## 2014-12-06 ENCOUNTER — Telehealth: Payer: Self-pay | Admitting: Oncology

## 2014-12-06 NOTE — Telephone Encounter (Signed)
sw. pt and advised on next week appt.Marland KitchenMarland KitchenMarland KitchenMarland Kitchenpt ok and aware

## 2014-12-10 ENCOUNTER — Ambulatory Visit (INDEPENDENT_AMBULATORY_CARE_PROVIDER_SITE_OTHER): Payer: Self-pay | Admitting: Emergency Medicine

## 2014-12-10 ENCOUNTER — Other Ambulatory Visit (HOSPITAL_BASED_OUTPATIENT_CLINIC_OR_DEPARTMENT_OTHER): Payer: Medicare Other

## 2014-12-10 VITALS — BP 132/70 | HR 69 | Temp 97.9°F | Resp 18 | Ht 64.0 in | Wt 165.0 lb

## 2014-12-10 DIAGNOSIS — Z021 Encounter for pre-employment examination: Secondary | ICD-10-CM

## 2014-12-10 DIAGNOSIS — D693 Immune thrombocytopenic purpura: Secondary | ICD-10-CM

## 2014-12-10 DIAGNOSIS — Z024 Encounter for examination for driving license: Secondary | ICD-10-CM

## 2014-12-10 DIAGNOSIS — D696 Thrombocytopenia, unspecified: Secondary | ICD-10-CM | POA: Diagnosis present

## 2014-12-10 LAB — CBC WITH DIFFERENTIAL/PLATELET
BASO%: 0.5 % (ref 0.0–2.0)
Basophils Absolute: 0.1 10*3/uL (ref 0.0–0.1)
EOS ABS: 0.2 10*3/uL (ref 0.0–0.5)
EOS%: 1.5 % (ref 0.0–7.0)
HCT: 34 % — ABNORMAL LOW (ref 38.4–49.9)
HEMOGLOBIN: 11.3 g/dL — AB (ref 13.0–17.1)
LYMPH#: 2.5 10*3/uL (ref 0.9–3.3)
LYMPH%: 23.6 % (ref 14.0–49.0)
MCH: 29.6 pg (ref 27.2–33.4)
MCHC: 33.4 g/dL (ref 32.0–36.0)
MCV: 88.7 fL (ref 79.3–98.0)
MONO#: 0.8 10*3/uL (ref 0.1–0.9)
MONO%: 8 % (ref 0.0–14.0)
NEUT%: 66.4 % (ref 39.0–75.0)
NEUTROS ABS: 7 10*3/uL — AB (ref 1.5–6.5)
Platelets: 255 10*3/uL (ref 140–400)
RBC: 3.83 10*6/uL — ABNORMAL LOW (ref 4.20–5.82)
RDW: 13.5 % (ref 11.0–14.6)
WBC: 10.6 10*3/uL — AB (ref 4.0–10.3)

## 2014-12-10 NOTE — Progress Notes (Signed)
Subjective:  Patient ID: Steven Bell, male    DOB: 1945/08/21  Age: 68 y.o. MRN: [id number]  CC: Annual Exam   HPI Steven Bell presents  DOT  History Steven Bell has a past medical history of Hypertension; Hyperlipidemia; BPH (benign prostatic hyperplasia); and Kidney problem.   He has no past surgical history on file.   His  family history is not on file.  He   reports that he has never smoked. He does not have any smokeless tobacco history on file. He reports that he does not drink alcohol or use illicit drugs.  Outpatient Prescriptions Prior to Visit  Medication Sig Dispense Refill  . acetaminophen (TYLENOL) 500 MG tablet Take 500-1,000 mg by mouth every 6 (six) hours as needed for moderate pain.    Marland Kitchen atorvastatin (LIPITOR) 10 MG tablet Take 10 mg by mouth daily.    . metoprolol succinate (TOPROL-XL) 100 MG 24 hr tablet Take 100 mg by mouth daily. Take with or immediately following a meal.    . Multiple Vitamin (MULTIVITAMIN WITH MINERALS) TABS tablet Take 1 tablet by mouth daily.    . tamsulosin (FLOMAX) 0.4 MG CAPS capsule Take 0.4 mg by mouth at bedtime.    . finasteride (PROSCAR) 5 MG tablet Take 5 mg by mouth daily.     No facility-administered medications prior to visit.    Social History   Social History  . Marital Status: Single    Spouse Name: N/A  . Number of Children: N/A  . Years of Education: N/A   Social History Main Topics  . Smoking status: Never Smoker   . Smokeless tobacco: None  . Alcohol Use: No  . Drug Use: No  . Sexual Activity: Not Asked   Other Topics Concern  . None   Social History Narrative     Review of Systems  Constitutional: Negative for fever, chills and appetite change.  HENT: Negative for congestion, ear pain, postnasal drip, sinus pressure and sore throat.   Eyes: Negative for pain and redness.  Respiratory: Negative for cough, shortness of breath and wheezing.   Cardiovascular: Negative for leg swelling.    Gastrointestinal: Negative for nausea, vomiting, abdominal pain, diarrhea, constipation and blood in stool.  Endocrine: Negative for polyuria.  Genitourinary: Negative for dysuria, urgency, frequency and flank pain.  Musculoskeletal: Negative for gait problem.  Skin: Negative for rash.  Neurological: Negative for weakness and headaches.  Psychiatric/Behavioral: Negative for confusion and decreased concentration. The patient is not nervous/anxious.     Objective:  BP 132/70 mmHg  Pulse 69  Temp(Src) 97.9 F (36.6 C)  Resp 18  Ht 5\' 4"  (1.626 m)  Wt 165 lb (74.844 kg)  BMI 28.31 kg/m2  SpO2 98%  Physical Exam  Constitutional: He is oriented to person, place, and time. He appears well-developed and well-nourished. No distress.  HENT:  Head: Normocephalic and atraumatic.  Right Ear: External ear normal.  Left Ear: External ear normal.  Nose: Nose normal.  Eyes: Conjunctivae and EOM are normal. Pupils are equal, round, and reactive to light. No scleral icterus.  Neck: Normal range of motion. Neck supple. No tracheal deviation present.  Cardiovascular: Normal rate, regular rhythm and normal heart sounds.   Pulmonary/Chest: Effort normal. No respiratory distress. He has no wheezes. He has no rales.  Abdominal: He exhibits no mass. There is no tenderness. There is no rebound and no guarding.  Musculoskeletal: He exhibits no edema.  Lymphadenopathy:    He has no cervical adenopathy.  Neurological: He is alert and oriented to person, place, and time. Coordination normal.  Skin: Skin is warm and dry. No rash noted.  Psychiatric: He has a normal mood and affect. His behavior is normal.      Assessment & Plan:   Steven Bell was seen today for annual exam.  Diagnoses and all orders for this visit:  Encounter for commercial driver medical examination (CDME)   I am having Steven Bell maintain his metoprolol succinate, acetaminophen, multivitamin with minerals, tamsulosin, atorvastatin,  and finasteride.  No orders of the defined types were placed in this encounter.    Appropriate red flag conditions were discussed with the patient as well as actions that should be taken.  Patient expressed his understanding.  Follow-up: Return in about 1 year (around 12/10/2015).  Roselee Culver, MD

## 2014-12-19 ENCOUNTER — Telehealth: Payer: Self-pay

## 2014-12-19 NOTE — Telephone Encounter (Signed)
-----   Message from Owens Shark, NP sent at 12/19/2014 11:33 AM EDT ----- Please let Mr. Broner know blood counts are stable. Follow-up as scheduled.

## 2014-12-19 NOTE — Telephone Encounter (Signed)
As per Owens Shark call placed to Steven Bell to inform him blood counts were stable. Mr. Carrero verbalized understanding and inquired about next appt. Which is 02/24/15 with Dr. Benay Spice

## 2015-01-27 DIAGNOSIS — R7301 Impaired fasting glucose: Secondary | ICD-10-CM | POA: Diagnosis not present

## 2015-01-27 DIAGNOSIS — I10 Essential (primary) hypertension: Secondary | ICD-10-CM | POA: Diagnosis not present

## 2015-01-27 DIAGNOSIS — E785 Hyperlipidemia, unspecified: Secondary | ICD-10-CM | POA: Diagnosis not present

## 2015-01-27 DIAGNOSIS — Z862 Personal history of diseases of the blood and blood-forming organs and certain disorders involving the immune mechanism: Secondary | ICD-10-CM | POA: Diagnosis not present

## 2015-01-27 DIAGNOSIS — N400 Enlarged prostate without lower urinary tract symptoms: Secondary | ICD-10-CM | POA: Diagnosis not present

## 2015-02-24 ENCOUNTER — Other Ambulatory Visit (HOSPITAL_BASED_OUTPATIENT_CLINIC_OR_DEPARTMENT_OTHER): Payer: PRIVATE HEALTH INSURANCE

## 2015-02-24 ENCOUNTER — Ambulatory Visit (HOSPITAL_BASED_OUTPATIENT_CLINIC_OR_DEPARTMENT_OTHER): Payer: Medicare Other | Admitting: Oncology

## 2015-02-24 ENCOUNTER — Telehealth: Payer: Self-pay | Admitting: Oncology

## 2015-02-24 VITALS — BP 127/58 | HR 66 | Temp 98.6°F | Resp 18 | Ht 64.0 in | Wt 158.0 lb

## 2015-02-24 DIAGNOSIS — D696 Thrombocytopenia, unspecified: Secondary | ICD-10-CM | POA: Diagnosis not present

## 2015-02-24 DIAGNOSIS — R972 Elevated prostate specific antigen [PSA]: Secondary | ICD-10-CM | POA: Diagnosis not present

## 2015-02-24 DIAGNOSIS — N289 Disorder of kidney and ureter, unspecified: Secondary | ICD-10-CM | POA: Diagnosis not present

## 2015-02-24 DIAGNOSIS — D693 Immune thrombocytopenic purpura: Secondary | ICD-10-CM

## 2015-02-24 DIAGNOSIS — I10 Essential (primary) hypertension: Secondary | ICD-10-CM

## 2015-02-24 DIAGNOSIS — D649 Anemia, unspecified: Secondary | ICD-10-CM | POA: Diagnosis not present

## 2015-02-24 LAB — CBC WITH DIFFERENTIAL/PLATELET
BASO%: 0.6 % (ref 0.0–2.0)
BASOS ABS: 0 10*3/uL (ref 0.0–0.1)
EOS ABS: 0.2 10*3/uL (ref 0.0–0.5)
EOS%: 2.5 % (ref 0.0–7.0)
HEMATOCRIT: 34.8 % — AB (ref 38.4–49.9)
HEMOGLOBIN: 11.1 g/dL — AB (ref 13.0–17.1)
LYMPH%: 32.7 % (ref 14.0–49.0)
MCH: 28.5 pg (ref 27.2–33.4)
MCHC: 31.9 g/dL — ABNORMAL LOW (ref 32.0–36.0)
MCV: 89.5 fL (ref 79.3–98.0)
MONO#: 0.6 10*3/uL (ref 0.1–0.9)
MONO%: 9.2 % (ref 0.0–14.0)
NEUT%: 55 % (ref 39.0–75.0)
NEUTROS ABS: 3.8 10*3/uL (ref 1.5–6.5)
PLATELETS: 255 10*3/uL (ref 140–400)
RBC: 3.89 10*6/uL — AB (ref 4.20–5.82)
RDW: 14 % (ref 11.0–14.6)
WBC: 6.9 10*3/uL (ref 4.0–10.3)
lymph#: 2.3 10*3/uL (ref 0.9–3.3)

## 2015-02-24 NOTE — Telephone Encounter (Signed)
Gave patient avs report and appointments for May  °

## 2015-02-24 NOTE — Progress Notes (Signed)
  [facility] OFFICE PROGRESS NOTE   Diagnosis: ITP  INTERVAL HISTORY:   Steven Bell returns as scheduled. He feels well. He is working. No bleeding. He continues to perform self catheterization 3 times per day. He is followed by Dr. Diona Fanti.  Objective:  Vital signs in last 24 hours:  Blood pressure 127/58, pulse 66, temperature 98.6 F (37 C), temperature source Oral, resp. rate 18, height 5\' 4"  (1.626 m), weight 158 lb (71.668 kg), SpO2 100 %.    HEENT: Neck without mass Lymphatics: No cervical, supraclavicular, or inguinal nodes. Pea-sized bilateral axillary node versus prominent fatty tissue. Resp: Lungs clear bilaterally Cardio: Regular rate and rhythm GI: No hepatosplenomegaly Vascular: No leg edema   Lab Results:  Lab Results  Component Value Date   WBC 6.9 02/24/2015   HGB 11.1* 02/24/2015   HCT 34.8* 02/24/2015   MCV 89.5 02/24/2015   PLT 255 02/24/2015   NEUTROABS 3.8 02/24/2015     Medications: I have reviewed the patient's current medications.  Assessment/Plan: 1. Severe thrombocytopenia, likely secondary to ITP  Status post IVIG 02/08/2014 and 02/09/2014  Steroids initiated 02/08/2014, Decadron 02/10/2014  Started prednisone 02/11/2014  Prednisone decreased to 40 mg daily beginning 02/18/2014  Prednisone decreased to 30 mg daily beginning 02/27/2014  Prednisone decreased to 20 mg daily beginning 03/14/2014  Prednisone decreased to 10 mg daily beginning 03/26/2014  Prednisone decreased to 5 mg daily beginning 04/25/2014  Prednisone decreased to 2.5 mg daily on 05/21/2014  Prednisone taper to 2.5 mg every other day for 5 doses and then stop beginning on 06/18/2014  2. Renal insufficiency 3. Hypertension 4. Anemia-most likely secondary to renal insufficiency 5. Elevated PSA 6. Fever on hospital admission December 2015-potentially related to a urinary tract infection, cultures negative. 7. Prostatic hypertrophy/urinary  retention. He continues self-catheterization and is followed by urology    Disposition:  Steven Bell remains in clinical remission from ITP. He has mild anemia, likely secondary to renal insufficiency. We will perform additional diagnostic evaluation if he develops progressive anemia. He will contact us for symptoms of anemia.  Steven Bell will return for an office and lab visit in 4 months.  [name], MD  02/24/2015  12:05 PM

## 2015-03-17 ENCOUNTER — Encounter: Payer: Self-pay | Admitting: Dietician

## 2015-03-17 ENCOUNTER — Encounter: Payer: PRIVATE HEALTH INSURANCE | Attending: Family Medicine | Admitting: Dietician

## 2015-03-17 VITALS — Ht 65.0 in | Wt 156.0 lb

## 2015-03-17 DIAGNOSIS — Z713 Dietary counseling and surveillance: Secondary | ICD-10-CM | POA: Diagnosis not present

## 2015-03-17 DIAGNOSIS — R7303 Prediabetes: Secondary | ICD-10-CM | POA: Insufficient documentation

## 2015-03-17 NOTE — Patient Instructions (Signed)
Plan:  Aim for 4 Carb Choices per meal (60 grams) +/- 1 either way  Aim for 0-2 Carbs per snack if hungry  Include protein in moderation with your meals and snacks Consider reading food labels for Total Carbohydrate and Fat Grams of foods Consider  increasing your activity level by walking for 30 minutes daily as tolerated

## 2015-03-17 NOTE — Progress Notes (Signed)
  Medical Nutrition Therapy:  Appt start time: [id number] end time:  1500.   Assessment:  Primary concerns today: Patient is here today with his long time girlfriend.  He is here today due to increased blood sugar with HgbA1C of 6.4% 01/27/15.  Other hx includes ITP, stage 3 CKD and HTN. He was hospitalized with the ITP about a year ago and was on steroids for this at that time.  He has questions regarding the lipitor and DM.  Discussed for him to refer to his general MD.    Patient lives with his long term girlfriend.  She does the shopping and cooking.  He retired as an Sales promotion account executive of a [employer] and weighed 185 lbs about 5 years ago.  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  His weight has been stable at 155-160 lbs.  Preferred Learning Style:   No preference indicated   Learning Readiness:   Ready  MEDICATIONS:see list   DIETARY INTAKE:   24-hr recall:  B ( AM): Raisin bran and banana with 2% milk, 1-2 cups coffee with sugar free creamer (most mornings) Snk ( AM): rare NABS  L ( PM): LS ham and cheese sandwich on white bread with mayo OR BLT with chips OR 6" sub at [employer] (club or tuna) Snk ( PM): occasional tortilla chips OR NABS OR banana D ( PM): chicken thigh or pork with potato, corn, green beans OR beef stew Snk ( PM): occasional Peanut butter and crackers OR saltines with pimento cheese or american cheese Beverages: Pepsi zero,water   Usual physical activity: some walking with his job  Estimated energy needs: 1800 calories 200 g carbohydrates 135 g protein 50 g fat  Progress Towards Goal(s):  In progress.   Nutritional Diagnosis:  NB-1.1 Food and nutrition-related knowledge deficit As related to balance of carbohydrate, protein, and fat.  As evidenced by patient report.    Intervention:  Nutrition counseling and diabetes education initiated. Discussed Carb Counting by food group as method of portion control, reading food labels, and benefits of increased  activity. Also discussed basic physiology of Diabetes, target BG ranges pre and post meals, and A1c.   Plan:  Aim for 4 Carb Choices per meal (60 grams) +/- 1 either way  Aim for 0-2 Carbs per snack if hungry  Include protein in moderation with your meals and snacks Consider reading food labels for Total Carbohydrate and Fat Grams of foods Consider  increasing your activity level by walking for 30 minutes daily as tolerated  Teaching Method Utilized:  Visual Auditory Hands on  Handouts given during visit include: Living Well with Diabetes Carb Counting and Food Label handouts Meal Plan Card Snack list  Label reading  Barriers to learning/adherence to lifestyle change: none  Demonstrated degree of understanding via:  Teach Back   Monitoring/Evaluation:  Dietary intake, exercise, label reading, and body weight prn.

## 2015-06-24 ENCOUNTER — Ambulatory Visit (HOSPITAL_BASED_OUTPATIENT_CLINIC_OR_DEPARTMENT_OTHER): Payer: PRIVATE HEALTH INSURANCE | Admitting: Nurse Practitioner

## 2015-06-24 ENCOUNTER — Other Ambulatory Visit (HOSPITAL_BASED_OUTPATIENT_CLINIC_OR_DEPARTMENT_OTHER): Payer: PRIVATE HEALTH INSURANCE

## 2015-06-24 ENCOUNTER — Telehealth: Payer: Self-pay | Admitting: Oncology

## 2015-06-24 VITALS — BP 130/62 | HR 54 | Temp 98.1°F | Resp 18 | Ht 65.0 in | Wt 148.9 lb

## 2015-06-24 DIAGNOSIS — D649 Anemia, unspecified: Secondary | ICD-10-CM

## 2015-06-24 DIAGNOSIS — Z862 Personal history of diseases of the blood and blood-forming organs and certain disorders involving the immune mechanism: Secondary | ICD-10-CM

## 2015-06-24 DIAGNOSIS — N189 Chronic kidney disease, unspecified: Secondary | ICD-10-CM | POA: Diagnosis not present

## 2015-06-24 DIAGNOSIS — R972 Elevated prostate specific antigen [PSA]: Secondary | ICD-10-CM | POA: Diagnosis not present

## 2015-06-24 DIAGNOSIS — D693 Immune thrombocytopenic purpura: Secondary | ICD-10-CM

## 2015-06-24 DIAGNOSIS — I10 Essential (primary) hypertension: Secondary | ICD-10-CM

## 2015-06-24 LAB — CBC WITH DIFFERENTIAL/PLATELET
BASO%: 1 % (ref 0.0–2.0)
BASOS ABS: 0.1 10*3/uL (ref 0.0–0.1)
EOS%: 3.6 % (ref 0.0–7.0)
Eosinophils Absolute: 0.3 10*3/uL (ref 0.0–0.5)
HCT: 39.3 % (ref 38.4–49.9)
HEMOGLOBIN: 12.6 g/dL — AB (ref 13.0–17.1)
LYMPH%: 43 % (ref 14.0–49.0)
MCH: 27.8 pg (ref 27.2–33.4)
MCHC: 32 g/dL (ref 32.0–36.0)
MCV: 87 fL (ref 79.3–98.0)
MONO#: 0.6 10*3/uL (ref 0.1–0.9)
MONO%: 8.9 % (ref 0.0–14.0)
NEUT%: 43.5 % (ref 39.0–75.0)
NEUTROS ABS: 3.1 10*3/uL (ref 1.5–6.5)
Platelets: 249 10*3/uL (ref 140–400)
RBC: 4.52 10*6/uL (ref 4.20–5.82)
RDW: 14.7 % — ABNORMAL HIGH (ref 11.0–14.6)
WBC: 7.2 10*3/uL (ref 4.0–10.3)
lymph#: 3.1 10*3/uL (ref 0.9–3.3)

## 2015-06-24 NOTE — Progress Notes (Signed)
  [facility] OFFICE PROGRESS NOTE   Diagnosis:  ITP  INTERVAL HISTORY:   [name] returns as scheduled. He feels well. No bleeding. He continues self-catheterization 3-4 times a day. He has lost some weight since his last visit. He attributes the weight loss to decreasing portion size and sugar intake.  Objective:  Vital signs in last 24 hours:  Blood pressure 130/62, pulse 54, temperature 98.1 F (36.7 C), temperature source Oral, resp. rate 18, height 5\' 5"  (1.651 m), weight 148 lb 14.4 oz (67.541 kg), SpO2 100 %.    HEENT: No thrush or ulcers. Lymphatics: No palpable cervical, supraclavicular or inguinal lymph nodes. Question pea-sized bilateral axillary node versus prominent fatty tissue. Resp: Lungs clear bilaterally. Cardio: Regular rate and rhythm. GI: Abdomen soft and nontender. No organomegaly. Vascular: No leg edema.    Lab Results:  Lab Results  Component Value Date   WBC 7.2 06/24/2015   HGB 12.6* 06/24/2015   HCT 39.3 06/24/2015   MCV 87.0 06/24/2015   PLT 249 06/24/2015   NEUTROABS 3.1 06/24/2015    Imaging:  No results found.  Medications: I have reviewed the patient's current medications.  Assessment/Plan: 1. Severe thrombocytopenia, likely secondary to ITP  Status post IVIG 02/08/2014 and 02/09/2014  Steroids initiated 02/08/2014, Decadron 02/10/2014  Started prednisone 02/11/2014  Prednisone decreased to 40 mg daily beginning 02/18/2014  Prednisone decreased to 30 mg daily beginning 02/27/2014  Prednisone decreased to 20 mg daily beginning 03/14/2014  Prednisone decreased to 10 mg daily beginning 03/26/2014  Prednisone decreased to 5 mg daily beginning 04/25/2014  Prednisone decreased to 2.5 mg daily on 05/21/2014  Prednisone tapered to 2.5 mg every other day for 5 doses and then stopped on 06/18/2014  2. Renal insufficiency 3. Hypertension 4. Anemia-most likely secondary to renal insufficiency. Hemoglobin  improved 06/24/2015. 5. Elevated PSA 6. Fever on hospital admission December 2015-potentially related to a urinary tract infection, cultures negative. 7. Prostatic hypertrophy/urinary retention. He continues self-catheterization and is followed by urology   Disposition:[name] remains in clinical remission from ITP. He has persistent mild anemia, improved as compared to 4 months ago. He will return for a follow-up visit and CBC in 6 months. He will contact the office in the interim with any problems. We specifically discussed bruising/bleeding.  Plan reviewed with Dr. Benay Spice.  Ned Card ANP/GNP-BC   06/24/2015  12:14 PM

## 2015-06-24 NOTE — Telephone Encounter (Signed)
Gave pt apt & avs °

## 2015-07-28 DIAGNOSIS — E785 Hyperlipidemia, unspecified: Secondary | ICD-10-CM | POA: Diagnosis not present

## 2015-07-28 DIAGNOSIS — R7301 Impaired fasting glucose: Secondary | ICD-10-CM | POA: Diagnosis not present

## 2015-07-28 DIAGNOSIS — N183 Chronic kidney disease, stage 3 (moderate): Secondary | ICD-10-CM | POA: Diagnosis not present

## 2015-07-28 DIAGNOSIS — I10 Essential (primary) hypertension: Secondary | ICD-10-CM | POA: Diagnosis not present

## 2015-12-08 ENCOUNTER — Encounter: Payer: Self-pay | Admitting: Urgent Care

## 2015-12-08 ENCOUNTER — Ambulatory Visit (INDEPENDENT_AMBULATORY_CARE_PROVIDER_SITE_OTHER): Payer: Self-pay | Admitting: Urgent Care

## 2015-12-08 VITALS — BP 130/80 | HR 79 | Temp 97.9°F | Resp 17 | Ht 65.0 in | Wt 147.0 lb

## 2015-12-08 DIAGNOSIS — Z024 Encounter for examination for driving license: Secondary | ICD-10-CM

## 2015-12-08 DIAGNOSIS — I10 Essential (primary) hypertension: Secondary | ICD-10-CM

## 2015-12-08 DIAGNOSIS — N400 Enlarged prostate without lower urinary tract symptoms: Secondary | ICD-10-CM

## 2015-12-08 DIAGNOSIS — E785 Hyperlipidemia, unspecified: Secondary | ICD-10-CM

## 2015-12-08 NOTE — Progress Notes (Signed)
Patient ID: Steven Bell, male   DOB: 19-May-1945, 70 y.o.   MRN: [id number]   By signing my name below, I, Essence Howell, attest that this documentation has been prepared under the direction and in the presence of Jaynee Eagles, PA-C Electronically Signed: Ladene Artist, ED Scribe 12/08/2015 at 3:15 PM.  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 The patient reports that he feels well overall. He reports hospitalization during Christmas Eve 2015 for ITP which is monitored throughout the year by oncologist [name], MD.   Pt denies headaches, dizziness, chest pain, heart racing, palpations, sob, nausea, vomiting, abdominal pain, hematuria, lower extremity swelling, numbness, tingling. Pt denies tobacco or alcohol use.   The following portions of the patient's history were reviewed and updated as appropriate: allergies, current medications, past family history, past medical history, past social history and past surgical history.   Objective:   BP 130/80 (BP Location: Right Arm, Patient Position: Sitting, Cuff Size: Large)    Pulse 79    Temp 97.9 F (36.6 C) (Oral)    Resp 17    Ht 5\' 5"  (1.651 m)    Wt 147 lb (66.7 kg)    SpO2 100%    BMI 24.46 kg/m   Vision/hearing:  Visual Acuity Screening   Right eye Left eye Both eyes  Without correction:     With correction: 20/20 20/20 20/20    Patient can recognize and distinguish among traffic control signals and devices showing standard red, green, and amber colors.  Corrective lenses required: Yes  Monocular Vision?: No  Hearing aid requirement: No  Physical Exam  Constitutional: He is oriented to person, place, and time. He appears well-developed and well-nourished.  HENT:  TM's intact bilaterally, no effusions or erythema. Nasal turbinates pink and moist, nasal passages patent. No sinus tenderness. Oropharynx clear, mucous membranes moist, dentition in good  repair.  Eyes: Conjunctivae and EOM are normal. Pupils are equal, round, and reactive to light. Right eye exhibits no discharge. Left eye exhibits no discharge. No scleral icterus.  Neck: Normal range of motion. Neck supple. No thyromegaly present.  Cardiovascular: Normal rate, regular rhythm and intact distal pulses.  Exam reveals no gallop and no friction rub.   No murmur heard. Pulmonary/Chest: No stridor. No respiratory distress. He has no wheezes. He has no rales.  Abdominal: Soft. Bowel sounds are normal. He exhibits no distension and no mass. There is no tenderness.  Musculoskeletal: Normal range of motion. He exhibits no edema or tenderness.  Lymphadenopathy:    He has no cervical adenopathy.  Neurological: He is alert and oriented to person, place, and time. He has normal reflexes.  Skin: Skin is warm and dry. No rash noted. No erythema. No pallor.  Psychiatric: He has a normal mood and affect.   Labs: Urine: Protein - 30, Blood - trace, Sugar - negative, Spec Grav - 1.015  Assessment:    Healthy male exam.  Meets standards, but periodic monitoring required due to HTN, HL.  Driver qualified only for 1 year.    Plan:   Medical examiners certificate completed and printed. Return as needed.

## 2015-12-08 NOTE — Patient Instructions (Addendum)
Keeping you healthy  Get these tests  Blood pressure- Have your blood pressure checked once a year by your healthcare provider.  Normal blood pressure is 120/80  Weight- Have your body mass index (BMI) calculated to screen for obesity.  BMI is a measure of body fat based on height and weight. You can also calculate your own BMI at www.[facility].com/bmi/.  Cholesterol- Have your cholesterol checked every year.  Diabetes- Have your blood sugar checked regularly if you have high blood pressure, high cholesterol, have a family history of diabetes or if you are overweight.  Screening for Colon Cancer- Colonoscopy starting at age 50.  Screening may begin sooner depending on your family history and other health conditions. Follow up colonoscopy as directed by your Gastroenterologist.  Screening for Prostate Cancer- Both blood work (PSA) and a rectal exam help screen for Prostate Cancer.  Screening begins at age 40 with African-American men and at age 50 with Caucasian men.  Screening may begin sooner depending on your family history.  Take these medicines  Aspirin- One aspirin daily can help prevent Heart disease and Stroke.  Flu shot- Every fall.  Tetanus- Every 10 years.  Zostavax- Once after the age of 60 to prevent Shingles.  Pneumonia shot- Once after the age of 65; if you are younger than 65, ask your healthcare provider if you need a Pneumonia shot.  Take these steps  Don't smoke- If you do smoke, talk to your doctor about quitting.  For tips on how to quit, go to www.smokefree.gov or call 1-800-QUIT-NOW.  Be physically active- Exercise 5 days a week for at least 30 minutes.  If you are not already physically active start slow and gradually work up to 30 minutes of moderate physical activity.  Examples of moderate activity include walking briskly, mowing the yard, dancing, swimming, bicycling, etc.  Eat a healthy diet- Eat a variety of healthy food such as fruits, vegetables, low  fat milk, low fat cheese, yogurt, lean meant, poultry, fish, beans, tofu, etc. For more information go to www.thenutritionsource.org  Drink alcohol in moderation- Limit alcohol intake to less than two drinks a day. Never drink and drive.  Dentist- Brush and floss twice daily; visit your dentist twice a year.  Depression- Your emotional health is as important as your physical health. If you're feeling down, or losing interest in things you would normally enjoy please talk to your healthcare provider.  Eye exam- Visit your eye doctor every year.  Safe sex- If you may be exposed to a sexually transmitted infection, use a condom.  Seat belts- Seat belts can save your life; always wear one.  Smoke/Carbon Monoxide detectors- These detectors need to be installed on the appropriate level of your home.  Replace batteries at least once a year.  Skin cancer- When out in the sun, cover up and use sunscreen 15 SPF or higher.  Violence- If anyone is threatening you, please tell your healthcare provider.  Living Will/ Health care power of attorney- Speak with your healthcare provider and family.    IF you received an x-ray today, you will receive an invoice from [facility]. Please contact  Radiology at [phone] with questions or concerns regarding your invoice.   IF you received labwork today, you will receive an invoice from [facility]. Please contact [facility] at [phone] with questions or concerns regarding your invoice.   Our billing staff will not be able to assist you with questions regarding bills from these companies.    You will be contacted with the lab results as soon as they are available. The fastest way to get your results is to activate your My Chart account. Instructions are located on the last page of this paperwork. If you have not heard from us regarding the results in 2 weeks, please contact this office.      

## 2015-12-25 ENCOUNTER — Ambulatory Visit (HOSPITAL_BASED_OUTPATIENT_CLINIC_OR_DEPARTMENT_OTHER): Payer: PRIVATE HEALTH INSURANCE | Admitting: Oncology

## 2015-12-25 ENCOUNTER — Telehealth: Payer: Self-pay | Admitting: Oncology

## 2015-12-25 ENCOUNTER — Other Ambulatory Visit (HOSPITAL_BASED_OUTPATIENT_CLINIC_OR_DEPARTMENT_OTHER): Payer: PRIVATE HEALTH INSURANCE

## 2015-12-25 VITALS — BP 138/75 | HR 52 | Temp 97.5°F | Resp 16 | Ht 65.0 in | Wt 146.8 lb

## 2015-12-25 DIAGNOSIS — D693 Immune thrombocytopenic purpura: Secondary | ICD-10-CM

## 2015-12-25 DIAGNOSIS — Z862 Personal history of diseases of the blood and blood-forming organs and certain disorders involving the immune mechanism: Secondary | ICD-10-CM

## 2015-12-25 DIAGNOSIS — N400 Enlarged prostate without lower urinary tract symptoms: Secondary | ICD-10-CM

## 2015-12-25 DIAGNOSIS — R972 Elevated prostate specific antigen [PSA]: Secondary | ICD-10-CM

## 2015-12-25 DIAGNOSIS — D649 Anemia, unspecified: Secondary | ICD-10-CM

## 2015-12-25 DIAGNOSIS — N289 Disorder of kidney and ureter, unspecified: Secondary | ICD-10-CM

## 2015-12-25 DIAGNOSIS — R339 Retention of urine, unspecified: Secondary | ICD-10-CM

## 2015-12-25 LAB — CBC WITH DIFFERENTIAL/PLATELET
BASO%: 0.8 % (ref 0.0–2.0)
BASOS ABS: 0.1 10*3/uL (ref 0.0–0.1)
EOS ABS: 0.3 10*3/uL (ref 0.0–0.5)
EOS%: 3.3 % (ref 0.0–7.0)
HEMATOCRIT: 37.6 % — AB (ref 38.4–49.9)
HEMOGLOBIN: 12.3 g/dL — AB (ref 13.0–17.1)
LYMPH#: 3.1 10*3/uL (ref 0.9–3.3)
LYMPH%: 37.7 % (ref 14.0–49.0)
MCH: 29.3 pg (ref 27.2–33.4)
MCHC: 32.6 g/dL (ref 32.0–36.0)
MCV: 89.8 fL (ref 79.3–98.0)
MONO#: 0.7 10*3/uL (ref 0.1–0.9)
MONO%: 8.1 % (ref 0.0–14.0)
NEUT%: 50.1 % (ref 39.0–75.0)
NEUTROS ABS: 4.2 10*3/uL (ref 1.5–6.5)
PLATELETS: 256 10*3/uL (ref 140–400)
RBC: 4.19 10*6/uL — ABNORMAL LOW (ref 4.20–5.82)
RDW: 13 % (ref 11.0–14.6)
WBC: 8.3 10*3/uL (ref 4.0–10.3)

## 2015-12-25 NOTE — Progress Notes (Signed)
  [facility] OFFICE PROGRESS NOTE   Diagnosis: ITP  INTERVAL HISTORY:   Mr. Cannoy returns as scheduled. He feels well. No bleeding or bruising. He continues self-catheterization 3-4 times per day. He has not followed up with urology. He reports intentional weight loss with a change in his diet.  Objective:  Vital signs in last 24 hours:  Blood pressure 138/75, pulse (!) 52, temperature 97.5 F (36.4 C), temperature source Oral, resp. rate 16, height 5\' 5"  (1.651 m), weight 146 lb 12.8 oz (66.6 kg), SpO2 100 %.    HEENT: Neck without mass Lymphatics: Less than 1 cm bilateral axillary node versus prominent fat pads. No cervical or supraclavicular nodes. Resp: Lungs clear bilaterally Cardio: Regular rate and rhythm GI: No hepatosplenomegaly Vascular: No leg edema   Lab Results:  Lab Results  Component Value Date   WBC 8.3 12/25/2015   HGB 12.3 (L) 12/25/2015   HCT 37.6 (L) 12/25/2015   MCV 89.8 12/25/2015   PLT 256 12/25/2015   NEUTROABS 4.2 12/25/2015     Medications: I have reviewed the patient's current medications.  Assessment/Plan: 1. Severe thrombocytopenia, likely secondary to ITP  Status post IVIG 02/08/2014 and 02/09/2014  Steroids initiated 02/08/2014, Decadron 02/10/2014  Started prednisone 02/11/2014  Prednisone decreased to 40 mg daily beginning 02/18/2014  Prednisone decreased to 30 mg daily beginning 02/27/2014  Prednisone decreased to 20 mg daily beginning 03/14/2014  Prednisone decreased to 10 mg daily beginning 03/26/2014  Prednisone decreased to 5 mg daily beginning 04/25/2014  Prednisone decreased to 2.5 mg daily on 05/21/2014  Prednisone tapered to 2.5 mg every other day for 5 doses and then stopped on 06/18/2014  2. Renal insufficiency 3. Hypertension 4. Anemia-most likely secondary to renal insufficiency. Hemoglobin improved 06/24/2015. 5. Elevated PSA 6. Fever on hospital admission December 2015-potentially  related to a urinary tract infection, cultures negative. 7. Prostatic hypertrophy/urinary retention. He continues self-catheterization and is followed by urology   Disposition:  He remains in clinical remission from ITP. I encouraged him to follow-up with urology for management of the urinary retention. He will return for an office visit in 8 months. He will contact us for bruising or bleeding.    [name], MD  12/25/2015  11:16 AM

## 2015-12-25 NOTE — Telephone Encounter (Signed)
GAVE PATIENT AVS REPORT AND APPOINTMENTS FOR July.  °

## 2016-01-29 DIAGNOSIS — Z862 Personal history of diseases of the blood and blood-forming organs and certain disorders involving the immune mechanism: Secondary | ICD-10-CM | POA: Diagnosis not present

## 2016-01-29 DIAGNOSIS — E785 Hyperlipidemia, unspecified: Secondary | ICD-10-CM | POA: Diagnosis not present

## 2016-01-29 DIAGNOSIS — I10 Essential (primary) hypertension: Secondary | ICD-10-CM | POA: Diagnosis not present

## 2016-01-29 DIAGNOSIS — N183 Chronic kidney disease, stage 3 (moderate): Secondary | ICD-10-CM | POA: Diagnosis not present

## 2016-01-29 DIAGNOSIS — Z1211 Encounter for screening for malignant neoplasm of colon: Secondary | ICD-10-CM | POA: Diagnosis not present

## 2016-01-29 DIAGNOSIS — R7301 Impaired fasting glucose: Secondary | ICD-10-CM | POA: Diagnosis not present

## 2016-01-29 DIAGNOSIS — R972 Elevated prostate specific antigen [PSA]: Secondary | ICD-10-CM | POA: Diagnosis not present

## 2016-07-15 DIAGNOSIS — M25562 Pain in left knee: Secondary | ICD-10-CM | POA: Diagnosis not present

## 2016-07-15 DIAGNOSIS — R55 Syncope and collapse: Secondary | ICD-10-CM | POA: Diagnosis not present

## 2016-07-29 DIAGNOSIS — R972 Elevated prostate specific antigen [PSA]: Secondary | ICD-10-CM | POA: Diagnosis not present

## 2016-07-29 DIAGNOSIS — R7301 Impaired fasting glucose: Secondary | ICD-10-CM | POA: Diagnosis not present

## 2016-07-29 DIAGNOSIS — Z862 Personal history of diseases of the blood and blood-forming organs and certain disorders involving the immune mechanism: Secondary | ICD-10-CM | POA: Diagnosis not present

## 2016-07-29 DIAGNOSIS — Z79899 Other long term (current) drug therapy: Secondary | ICD-10-CM | POA: Diagnosis not present

## 2016-07-29 DIAGNOSIS — E785 Hyperlipidemia, unspecified: Secondary | ICD-10-CM | POA: Diagnosis not present

## 2016-07-29 DIAGNOSIS — N183 Chronic kidney disease, stage 3 (moderate): Secondary | ICD-10-CM | POA: Diagnosis not present

## 2016-07-29 DIAGNOSIS — I10 Essential (primary) hypertension: Secondary | ICD-10-CM | POA: Diagnosis not present

## 2016-07-29 DIAGNOSIS — Z1159 Encounter for screening for other viral diseases: Secondary | ICD-10-CM | POA: Diagnosis not present

## 2016-07-29 DIAGNOSIS — D649 Anemia, unspecified: Secondary | ICD-10-CM | POA: Diagnosis not present

## 2016-07-29 DIAGNOSIS — M25562 Pain in left knee: Secondary | ICD-10-CM | POA: Diagnosis not present

## 2016-08-19 DIAGNOSIS — R972 Elevated prostate specific antigen [PSA]: Secondary | ICD-10-CM | POA: Diagnosis not present

## 2016-08-23 ENCOUNTER — Ambulatory Visit (HOSPITAL_BASED_OUTPATIENT_CLINIC_OR_DEPARTMENT_OTHER): Payer: PRIVATE HEALTH INSURANCE | Admitting: Oncology

## 2016-08-23 ENCOUNTER — Other Ambulatory Visit (HOSPITAL_BASED_OUTPATIENT_CLINIC_OR_DEPARTMENT_OTHER): Payer: PRIVATE HEALTH INSURANCE

## 2016-08-23 ENCOUNTER — Telehealth: Payer: Self-pay | Admitting: Oncology

## 2016-08-23 VITALS — BP 129/69 | HR 55 | Temp 97.9°F | Resp 18 | Ht 65.0 in | Wt 140.4 lb

## 2016-08-23 DIAGNOSIS — R339 Retention of urine, unspecified: Secondary | ICD-10-CM

## 2016-08-23 DIAGNOSIS — N289 Disorder of kidney and ureter, unspecified: Secondary | ICD-10-CM

## 2016-08-23 DIAGNOSIS — D696 Thrombocytopenia, unspecified: Secondary | ICD-10-CM

## 2016-08-23 DIAGNOSIS — D693 Immune thrombocytopenic purpura: Secondary | ICD-10-CM

## 2016-08-23 DIAGNOSIS — D649 Anemia, unspecified: Secondary | ICD-10-CM | POA: Diagnosis not present

## 2016-08-23 DIAGNOSIS — R972 Elevated prostate specific antigen [PSA]: Secondary | ICD-10-CM

## 2016-08-23 DIAGNOSIS — I10 Essential (primary) hypertension: Secondary | ICD-10-CM | POA: Diagnosis not present

## 2016-08-23 DIAGNOSIS — N400 Enlarged prostate without lower urinary tract symptoms: Secondary | ICD-10-CM | POA: Diagnosis not present

## 2016-08-23 LAB — CBC WITH DIFFERENTIAL/PLATELET
BASO%: 0.9 % (ref 0.0–2.0)
BASOS ABS: 0.1 10*3/uL (ref 0.0–0.1)
EOS ABS: 0.1 10*3/uL (ref 0.0–0.5)
EOS%: 1.5 % (ref 0.0–7.0)
HEMATOCRIT: 35.2 % — AB (ref 38.4–49.9)
HEMOGLOBIN: 11.9 g/dL — AB (ref 13.0–17.1)
LYMPH#: 3 10*3/uL (ref 0.9–3.3)
LYMPH%: 31.3 % (ref 14.0–49.0)
MCH: 30 pg (ref 27.2–33.4)
MCHC: 33.6 g/dL (ref 32.0–36.0)
MCV: 89.2 fL (ref 79.3–98.0)
MONO#: 0.8 10*3/uL (ref 0.1–0.9)
MONO%: 8.6 % (ref 0.0–14.0)
NEUT%: 57.7 % (ref 39.0–75.0)
NEUTROS ABS: 5.5 10*3/uL (ref 1.5–6.5)
PLATELETS: 291 10*3/uL (ref 140–400)
RBC: 3.95 10*6/uL — ABNORMAL LOW (ref 4.20–5.82)
RDW: 13.7 % (ref 11.0–14.6)
WBC: 9.5 10*3/uL (ref 4.0–10.3)

## 2016-08-23 NOTE — Telephone Encounter (Signed)
Mailed appt for 02/24/17.

## 2016-08-23 NOTE — Progress Notes (Signed)
  [place] OFFICE PROGRESS NOTE   Diagnosis: ITP, anemia  INTERVAL HISTORY:   Steven Bell returns as scheduled. He feels well. He continues self-catheterization. No bleeding. Good appetite. He reports weight loss secondary to a change of his diet.  Objective:  Vital signs in last 24 hours:  Blood pressure 129/69, pulse (!) 55, temperature 97.9 F (36.6 C), temperature source Oral, resp. rate 18, height 5\' 5"  (1.651 m), weight 140 lb 6.4 oz (63.7 kg), SpO2 100 %.    HEENT: Neck without mass Lymphatics: No cervical, supraclavicular, or axillary nodes Resp: Lungs clear bilaterally Cardio: Regular rate and rhythm GI: No hepatosplenomegaly, nontender Vascular: No leg edema   Lab Results:  Lab Results  Component Value Date   WBC 9.5 08/23/2016   HGB 11.9 (L) 08/23/2016   HCT 35.2 (L) 08/23/2016   MCV 89.2 08/23/2016   PLT 291 08/23/2016   NEUTROABS 5.5 08/23/2016    Medications: I have reviewed the patient's current medications.  Assessment/Plan: 1. Severe thrombocytopenia, likely secondary to ITP  Status post IVIG 02/08/2014 and 02/09/2014  Steroids initiated 02/08/2014, Decadron 02/10/2014  Started prednisone 02/11/2014  Prednisone decreased to 40 mg daily beginning 02/18/2014  Prednisone decreased to 30 mg daily beginning 02/27/2014  Prednisone decreased to 20 mg daily beginning 03/14/2014  Prednisone decreased to 10 mg daily beginning 03/26/2014  Prednisone decreased to 5 mg daily beginning 04/25/2014  Prednisone decreased to 2.5 mg daily on 05/21/2014  Prednisone tapered to 2.5 mg every other day for 5 doses and then stopped on 06/18/2014  2. Renal insufficiency 3. Hypertension 4. Anemia-most likely secondary to renal insufficiency. Hemoglobin improved 06/24/2015. 5. Elevated PSA 6. Fever on hospital admission December 2015-potentially related to a urinary tract infection, cultures negative. 7. Prostatic hypertrophy/urinary retention.  He continues self-catheterization and is followed by urology    Disposition:  He is stable from a hematologic standpoint. The platelet count is normal. He has stable mild chronic anemia, likely related to chronic renal insufficiency.  He continues follow-up with urology for the history of an elevated PSA and urinary retention.  He will return for an office visit and CBC in 6 months.  15 minutes were spent with the patient today. The majority of the time was used for counseling and coordination of care.  Donneta Romberg, MD  08/23/2016  1:57 PM

## 2016-11-26 ENCOUNTER — Ambulatory Visit (INDEPENDENT_AMBULATORY_CARE_PROVIDER_SITE_OTHER): Payer: Self-pay | Admitting: Physician Assistant

## 2016-11-26 ENCOUNTER — Encounter: Payer: Self-pay | Admitting: Physician Assistant

## 2016-11-26 VITALS — BP 114/70 | HR 64 | Temp 97.6°F | Resp 16 | Ht 63.0 in | Wt 144.4 lb

## 2016-11-26 DIAGNOSIS — Z0289 Encounter for other administrative examinations: Secondary | ICD-10-CM

## 2016-11-26 NOTE — Progress Notes (Signed)
[facility] Medical Examination   Steven Bell is a 71 y.o. male with a pertinent medical history of HTN who presents today for a commercial driver fitness determination physical exam. The patient reports no problems today. In the past the patient reports receiving 1 year certificates. He denies focal neurological deficits, vision and hearing changes. He denies the habitual use of benzodiazepines, opioids, amphetamines and denies illicit drug use. Denies a history of sleep apnea.   Current medications, family history, allergies, social history reviewed by me and exist elsewhere in the encounter.   Review of Systems  Constitutional: Negative for chills, diaphoresis and fever.  Eyes: Negative.   Respiratory: Negative for cough, hemoptysis, sputum production, shortness of breath and wheezing.   Cardiovascular: Negative for chest pain, orthopnea and leg swelling.  Gastrointestinal: Negative for abdominal pain, blood in stool, constipation, diarrhea, heartburn, melena, nausea and vomiting.  Genitourinary: Negative for flank pain.  Skin: Negative for rash.  Neurological: Negative for dizziness, sensory change, speech change, focal weakness and headaches.    Objective:     Vision/hearing:  Visual Acuity Screening   Right eye Left eye Both eyes  Without correction:     With correction: 20/20 20/15 20/15   Comments: Color Screen- Passed Field of Vision- 85 bilateral  Hearing Screening Comments: Whisper test passed, bilateral 10 feet  Applicant can recognize and distinguish among traffic control signals and devices showing standard red, green, and amber colors.  Corrective lenses required: Yes  Monocular Vision?: No  Hearing aid requirement: No  Physical Exam  Constitutional: He is oriented to person, place, and time. He appears well-developed. He is active and cooperative.  Non-toxic appearance.  HENT:  Right Ear: Hearing, tympanic membrane, external ear and ear canal normal.   Left Ear: Hearing, tympanic membrane, external ear and ear canal normal.  Nose: Nose normal. Right sinus exhibits no maxillary sinus tenderness and no frontal sinus tenderness. Left sinus exhibits no maxillary sinus tenderness and no frontal sinus tenderness.  Mouth/Throat: Uvula is midline, oropharynx is clear and moist and mucous membranes are normal. No oropharyngeal exudate, posterior oropharyngeal edema or tonsillar abscesses.  Eyes: Pupils are equal, round, and reactive to light. Conjunctivae and EOM are normal.  Cardiovascular: Normal rate, regular rhythm, S1 normal, S2 normal, normal heart sounds, intact distal pulses and normal pulses.  Exam reveals no gallop and no friction rub.   No murmur heard. Pulmonary/Chest: Effort normal. No stridor. No tachypnea. No respiratory distress. He has no wheezes. He has no rales.  Abdominal: He exhibits no distension.  Musculoskeletal: He exhibits no edema.  Lymphadenopathy:       Head (right side): No submandibular and no tonsillar adenopathy present.       Head (left side): No submandibular and no tonsillar adenopathy present.    He has no cervical adenopathy.  Neurological: He is alert and oriented to person, place, and time. He has normal strength and normal reflexes. He is not disoriented. No cranial nerve deficit or sensory deficit. He exhibits normal muscle tone. Coordination and gait normal.  Skin: Skin is warm and dry. He is not diaphoretic. No pallor.  Psychiatric: His behavior is normal.  Vitals reviewed.   BP 114/70 (BP Location: Left Arm, Patient Position: Sitting, Cuff Size: Normal)   Pulse 64   Temp 97.6 F (36.4 C) (Oral)   Resp 16   Ht 5\' 3"  (1.6 m)   Wt 144 lb 6.4 oz (65.5 kg)   SpO2 99%   BMI 25.58  kg/m   Labs: Comments: UA: SG-1.015; Blood-Neg; Glucose-Neg; Pro-Neg  Assessment:    Healthy male exam.  Meets standards in 65 CFR 391.41;  qualifies for 1 year certificate due to history of well controlled HTN.      Plan:    Medical examiners certificate completed and printed. Return as needed.    Philis Fendt, MS, PA-C 2:46 PM, 11/26/2016

## 2016-11-26 NOTE — Patient Instructions (Signed)
     IF you received an x-ray today, you will receive an invoice from [facility]. Please contact [facility] at [phone] with questions or concerns regarding your invoice.   IF you received labwork today, you will receive an invoice from [facility]. Please contact [facility] at [phone] with questions or concerns regarding your invoice.   Our billing staff will not be able to assist you with questions regarding bills from these companies.  You will be contacted with the lab results as soon as they are available. The fastest way to get your results is to activate your My Chart account. Instructions are located on the last page of this paperwork. If you have not heard from us regarding the results in 2 weeks, please contact this office.     

## 2017-02-24 ENCOUNTER — Inpatient Hospital Stay: Payer: PRIVATE HEALTH INSURANCE | Attending: Oncology | Admitting: Oncology

## 2017-02-24 ENCOUNTER — Telehealth: Payer: Self-pay | Admitting: Oncology

## 2017-02-24 ENCOUNTER — Inpatient Hospital Stay: Payer: PRIVATE HEALTH INSURANCE

## 2017-02-24 VITALS — BP 119/63 | HR 65 | Temp 97.8°F | Resp 18 | Wt 146.7 lb

## 2017-02-24 DIAGNOSIS — D649 Anemia, unspecified: Secondary | ICD-10-CM | POA: Insufficient documentation

## 2017-02-24 DIAGNOSIS — N289 Disorder of kidney and ureter, unspecified: Secondary | ICD-10-CM

## 2017-02-24 DIAGNOSIS — D693 Immune thrombocytopenic purpura: Secondary | ICD-10-CM

## 2017-02-24 DIAGNOSIS — R972 Elevated prostate specific antigen [PSA]: Secondary | ICD-10-CM | POA: Insufficient documentation

## 2017-02-24 DIAGNOSIS — N400 Enlarged prostate without lower urinary tract symptoms: Secondary | ICD-10-CM

## 2017-02-24 DIAGNOSIS — I10 Essential (primary) hypertension: Secondary | ICD-10-CM | POA: Diagnosis not present

## 2017-02-24 DIAGNOSIS — R339 Retention of urine, unspecified: Secondary | ICD-10-CM | POA: Diagnosis not present

## 2017-02-24 LAB — BASIC METABOLIC PANEL
ANION GAP: 9 (ref 3–11)
BUN: 24 mg/dL (ref 7–26)
CHLORIDE: 109 mmol/L (ref 98–109)
CO2: 25 mmol/L (ref 22–29)
Calcium: 9.6 mg/dL (ref 8.4–10.4)
Creatinine, Ser: 1.73 mg/dL — ABNORMAL HIGH (ref 0.70–1.30)
GFR calc non Af Amer: 38 mL/min — ABNORMAL LOW (ref >60)
GFR, EST AFRICAN AMERICAN: 44 mL/min — AB (ref >60)
Glucose, Bld: 104 mg/dL (ref 70–140)
POTASSIUM: 4.4 mmol/L (ref 3.5–5.1)
SODIUM: 143 mmol/L (ref 136–145)

## 2017-02-24 LAB — CBC WITH DIFFERENTIAL/PLATELET
BASOS ABS: 0.1 10*3/uL (ref 0.0–0.1)
BASOS PCT: 1 %
EOS ABS: 0.1 10*3/uL (ref 0.0–0.5)
Eosinophils Relative: 2 %
HCT: 37.8 % — ABNORMAL LOW (ref 38.4–49.9)
HEMOGLOBIN: 12.5 g/dL — AB (ref 13.0–17.1)
LYMPHS ABS: 2.6 10*3/uL (ref 0.9–3.3)
Lymphocytes Relative: 34 %
MCH: 30 pg (ref 27.2–33.4)
MCHC: 32.9 g/dL (ref 32.0–36.0)
MCV: 91.3 fL (ref 79.3–98.0)
Monocytes Absolute: 0.5 10*3/uL (ref 0.1–0.9)
Monocytes Relative: 7 %
NEUTROS PCT: 56 %
Neutro Abs: 4.4 10*3/uL (ref 1.5–6.5)
Platelets: 271 10*3/uL (ref 140–400)
RBC: 4.15 MIL/uL — AB (ref 4.20–5.82)
RDW: 13.9 % (ref 11.0–15.6)
WBC: 7.8 10*3/uL (ref 4.0–10.3)

## 2017-02-24 NOTE — Progress Notes (Signed)
  [place] OFFICE PROGRESS NOTE   Diagnosis: ITP  INTERVAL HISTORY:   Steven Bell returns as scheduled.  He feels well.  He is followed by urology for urinary retention.  He continues to perform self-catheterization.  He reports the PSA was elevated and improved following treatment for a UTI. No other complaint.  Objective:  Vital signs in last 24 hours:  Blood pressure 119/63, pulse 65, temperature 97.8 F (36.6 C), temperature source Oral, resp. rate 18, weight 146 lb 11.2 oz (66.5 kg), SpO2 100 %.    HEENT: Neck without mass Lymphatics: No cervical, supraclavicular, or inguinal nodes.  "Shotty "bilateral axillary nodes. Resp: Lungs clear bilaterally Cardio: Regular rate and rhythm GI: No hepatosplenomegaly Vascular: No leg edema   Lab Results:  Lab Results  Component Value Date   WBC 7.8 02/24/2017   HGB 12.5 (L) 02/24/2017   HCT 37.8 (L) 02/24/2017   MCV 91.3 02/24/2017   PLT 271 02/24/2017   NEUTROABS 4.4 02/24/2017    CMP     Component Value Date/Time   NA 143 02/24/2017 1424   NA 142 04/16/2014 0900   K 4.4 02/24/2017 1424   K 3.6 04/16/2014 0900   CL 109 02/24/2017 1424   CO2 25 02/24/2017 1424   CO2 22 04/16/2014 0900   GLUCOSE 104 02/24/2017 1424   GLUCOSE 192 (H) 04/16/2014 0900   BUN 24 02/24/2017 1424   BUN 28.2 (H) 04/16/2014 0900   CREATININE 1.73 (H) 02/24/2017 1424   CREATININE 1.8 (H) 04/16/2014 0900   CALCIUM 9.6 02/24/2017 1424   CALCIUM 9.0 04/16/2014 0900   PROT 9.0 (H) 02/07/2014 1708   ALBUMIN 2.9 (L) 02/12/2014 0514   AST 23 02/07/2014 1708   ALT 14 02/07/2014 1708   ALKPHOS 62 02/07/2014 1708   BILITOT 0.9 02/07/2014 1708   GFRNONAA 38 (L) 02/24/2017 1424   GFRAA 44 (L) 02/24/2017 1424     Medications: I have reviewed the patient's current medications.   Assessment/Plan: 1. Severe thrombocytopenia, likely secondary to ITP  Status post IVIG 02/08/2014 and 02/09/2014  Steroids initiated 02/08/2014,  Decadron 02/10/2014  Started prednisone 02/11/2014  Prednisone decreased to 40 mg daily beginning 02/18/2014  Prednisone decreased to 30 mg daily beginning 02/27/2014  Prednisone decreased to 20 mg daily beginning 03/14/2014  Prednisone decreased to 10 mg daily beginning 03/26/2014  Prednisone decreased to 5 mg daily beginning 04/25/2014  Prednisone decreased to 2.5 mg daily on 05/21/2014  Prednisone tapered to 2.5 mg every other day for 5 doses and then stopped on 06/18/2014  2. Renal insufficiency 3. Hypertension 4. Anemia- mild, most likely secondary to renal insufficiency.  Hemoglobin stable. 5. Elevated PSA 6. Fever on hospital admission December 2015-potentially related to a urinary tract infection, cultures negative. 7. Prostatic hypertrophy/urinary retention. He continues self-catheterization and is followed by urology    Disposition: Steven Bell remains in clinical remission from ITP.  He will contact us for bleeding or bruising.  He will return for an office visit in 1 year.  He continues follow-up with Dr. Sabra Heck for internal medicine care and follow-up of renal insufficiency.  He is followed by Dr. Diona Fanti for the urinary retention.  15 minutes were spent with the patient today.  The majority of the time was used for counseling and coordination of care.  [name], MD  02/24/2017  3:36 PM

## 2017-02-24 NOTE — Telephone Encounter (Signed)
Scheduled appt per 1/10 los - Gave patient AVS and calender per los.  

## 2017-08-01 DIAGNOSIS — Z862 Personal history of diseases of the blood and blood-forming organs and certain disorders involving the immune mechanism: Secondary | ICD-10-CM | POA: Diagnosis not present

## 2017-08-01 DIAGNOSIS — Z6825 Body mass index (BMI) 25.0-25.9, adult: Secondary | ICD-10-CM | POA: Diagnosis not present

## 2017-08-01 DIAGNOSIS — N400 Enlarged prostate without lower urinary tract symptoms: Secondary | ICD-10-CM | POA: Diagnosis not present

## 2017-08-01 DIAGNOSIS — R972 Elevated prostate specific antigen [PSA]: Secondary | ICD-10-CM | POA: Diagnosis not present

## 2017-08-01 DIAGNOSIS — I129 Hypertensive chronic kidney disease with stage 1 through stage 4 chronic kidney disease, or unspecified chronic kidney disease: Secondary | ICD-10-CM | POA: Diagnosis not present

## 2017-08-01 DIAGNOSIS — R7301 Impaired fasting glucose: Secondary | ICD-10-CM | POA: Diagnosis not present

## 2017-08-01 DIAGNOSIS — N183 Chronic kidney disease, stage 3 (moderate): Secondary | ICD-10-CM | POA: Diagnosis not present

## 2017-08-01 DIAGNOSIS — D6489 Other specified anemias: Secondary | ICD-10-CM | POA: Diagnosis not present

## 2017-08-01 DIAGNOSIS — C61 Malignant neoplasm of prostate: Secondary | ICD-10-CM | POA: Diagnosis not present

## 2017-08-01 DIAGNOSIS — E785 Hyperlipidemia, unspecified: Secondary | ICD-10-CM | POA: Diagnosis not present

## 2017-09-13 DIAGNOSIS — M109 Gout, unspecified: Secondary | ICD-10-CM | POA: Diagnosis not present

## 2018-02-20 ENCOUNTER — Telehealth: Payer: Self-pay | Admitting: Oncology

## 2018-02-20 NOTE — Telephone Encounter (Signed)
R/s appt from 1/10 to 1/13 due to GBS on PAL -spoke with patient .  pt is aware of appt date and time

## 2018-02-24 ENCOUNTER — Other Ambulatory Visit: Payer: PRIVATE HEALTH INSURANCE

## 2018-02-24 ENCOUNTER — Ambulatory Visit: Payer: PRIVATE HEALTH INSURANCE | Admitting: Oncology

## 2018-02-27 ENCOUNTER — Telehealth: Payer: Self-pay | Admitting: Nurse Practitioner

## 2018-02-27 ENCOUNTER — Inpatient Hospital Stay: Payer: PRIVATE HEALTH INSURANCE | Attending: Nurse Practitioner

## 2018-02-27 ENCOUNTER — Inpatient Hospital Stay (HOSPITAL_BASED_OUTPATIENT_CLINIC_OR_DEPARTMENT_OTHER): Payer: PRIVATE HEALTH INSURANCE | Admitting: Nurse Practitioner

## 2018-02-27 ENCOUNTER — Encounter: Payer: Self-pay | Admitting: Nurse Practitioner

## 2018-02-27 VITALS — BP 133/73 | HR 56 | Temp 97.8°F | Resp 17 | Wt 146.1 lb

## 2018-02-27 DIAGNOSIS — I10 Essential (primary) hypertension: Secondary | ICD-10-CM | POA: Insufficient documentation

## 2018-02-27 DIAGNOSIS — C61 Malignant neoplasm of prostate: Secondary | ICD-10-CM | POA: Diagnosis not present

## 2018-02-27 DIAGNOSIS — D693 Immune thrombocytopenic purpura: Secondary | ICD-10-CM | POA: Insufficient documentation

## 2018-02-27 DIAGNOSIS — N289 Disorder of kidney and ureter, unspecified: Secondary | ICD-10-CM | POA: Insufficient documentation

## 2018-02-27 LAB — CBC WITH DIFFERENTIAL (CANCER CENTER ONLY)
Abs Immature Granulocytes: 0.02 10*3/uL (ref 0.00–0.07)
BASOS ABS: 0 10*3/uL (ref 0.0–0.1)
Basophils Relative: 0 %
EOS ABS: 0.2 10*3/uL (ref 0.0–0.5)
EOS PCT: 3 %
HCT: 38.9 % — ABNORMAL LOW (ref 39.0–52.0)
HEMOGLOBIN: 12.4 g/dL — AB (ref 13.0–17.0)
Immature Granulocytes: 0 %
LYMPHS ABS: 2.9 10*3/uL (ref 0.7–4.0)
Lymphocytes Relative: 37 %
MCH: 29.7 pg (ref 26.0–34.0)
MCHC: 31.9 g/dL (ref 30.0–36.0)
MCV: 93.3 fL (ref 80.0–100.0)
MONO ABS: 0.8 10*3/uL (ref 0.1–1.0)
Monocytes Relative: 10 %
NRBC: 0 % (ref 0.0–0.2)
Neutro Abs: 3.9 10*3/uL (ref 1.7–7.7)
Neutrophils Relative %: 50 %
Platelet Count: 242 10*3/uL (ref 150–400)
RBC: 4.17 MIL/uL — ABNORMAL LOW (ref 4.22–5.81)
RDW: 13.2 % (ref 11.5–15.5)
WBC Count: 7.9 10*3/uL (ref 4.0–10.5)

## 2018-02-27 LAB — BASIC METABOLIC PANEL - CANCER CENTER ONLY
Anion gap: 7 (ref 5–15)
BUN: 24 mg/dL — AB (ref 8–23)
CALCIUM: 9.2 mg/dL (ref 8.9–10.3)
CO2: 26 mmol/L (ref 22–32)
CREATININE: 1.67 mg/dL — AB (ref 0.61–1.24)
Chloride: 108 mmol/L (ref 98–111)
GFR, EST AFRICAN AMERICAN: 46 mL/min — AB (ref >60)
GFR, Estimated: 40 mL/min — ABNORMAL LOW (ref >60)
Glucose, Bld: 95 mg/dL (ref 70–99)
Potassium: 4.3 mmol/L (ref 3.5–5.1)
SODIUM: 141 mmol/L (ref 135–145)

## 2018-02-27 NOTE — Progress Notes (Signed)
  [place] OFFICE PROGRESS NOTE   Diagnosis: ITP  INTERVAL HISTORY:   Steven Bell returns as scheduled.  He denies any bleeding.  No fevers or sweats.  He reports he has been diagnosed with prostate cancer.  He sees Steven Bell.  Objective:  Vital signs in last 24 hours:  Blood pressure 133/73, pulse (!) 56, temperature 97.8 F (36.6 C), temperature source Oral, resp. rate 17, weight 146 lb 1.6 oz (66.3 kg), SpO2 100 %.    HEENT: Neck without mass. Lymphatics: No palpable cervical, supraclavicular or inguinal lymph nodes.  Shotty bilateral axillary lymph nodes. Resp: Lungs clear bilaterally. Cardio: Regular rate and rhythm. GI: Abdomen soft and nontender.  No hepatosplenomegaly. Vascular: No leg edema.   Lab Results:  Lab Results  Component Value Date   WBC 7.9 02/27/2018   HGB 12.4 (L) 02/27/2018   HCT 38.9 (L) 02/27/2018   MCV 93.3 02/27/2018   PLT 242 02/27/2018   NEUTROABS 3.9 02/27/2018    Imaging:  No results found.  Medications: I have reviewed the patient's current medications.  Assessment/Plan: 1. Severe thrombocytopenia, likely secondary to ITP  Status post IVIG 02/08/2014 and 02/09/2014  Steroids initiated 02/08/2014, Decadron 02/10/2014  Started prednisone 02/11/2014  Prednisone decreased to 40 mg daily beginning 02/18/2014  Prednisone decreased to 30 mg daily beginning 02/27/2014  Prednisone decreased to 20 mg daily beginning 03/14/2014  Prednisone decreased to 10 mg daily beginning 03/26/2014  Prednisone decreased to 5 mg daily beginning 04/25/2014  Prednisone decreased to 2.5 mg daily on 05/21/2014  Prednisone tapered to 2.5 mg every other day for 5 doses and then stopped on 06/18/2014  2. Renal insufficiency 3. Hypertension 4. Anemia- mild, most likely secondary to renal insufficiency.  Hemoglobin stable. 5. Elevated PSA 6. Fever on hospital admission December 2015-potentially related to a urinary tract  infection, cultures negative. 7. Prostatic hypertrophy/urinary retention. He continues self-catheterization and is followed by urology 8. Prostate cancer 06/24/2017 (left apex Gleason score 3+3 = 6 involves 10% of 1 core; right base lateral Gleason score 3+3 = 6 involves 10% of 1 core; right apex lateral findings suspicious for carcinoma)  Disposition: Steven Bell remains in clinical remission from ITP.  We reviewed the CBC from today.  Platelet count remains in normal range.  He understands to contact the office with bruising/bleeding.    He will continue follow-up with Steven Bell regarding the prostate cancer diagnosis.  He will return for a CBC and follow-up visit in 1 year.  He will contact the office in the interim as outlined above or with any other problems.  Plan reviewed with Dr. Luberta Bell ANP/GNP-BC   02/27/2018  3:35 PM

## 2018-02-27 NOTE — Telephone Encounter (Signed)
Printed calendar. °

## 2018-10-31 DIAGNOSIS — N183 Chronic kidney disease, stage 3 (moderate): Secondary | ICD-10-CM | POA: Diagnosis not present

## 2018-10-31 DIAGNOSIS — C61 Malignant neoplasm of prostate: Secondary | ICD-10-CM | POA: Diagnosis not present

## 2018-10-31 DIAGNOSIS — E785 Hyperlipidemia, unspecified: Secondary | ICD-10-CM | POA: Diagnosis not present

## 2018-10-31 DIAGNOSIS — Z1211 Encounter for screening for malignant neoplasm of colon: Secondary | ICD-10-CM | POA: Diagnosis not present

## 2018-10-31 DIAGNOSIS — Z23 Encounter for immunization: Secondary | ICD-10-CM | POA: Diagnosis not present

## 2018-10-31 DIAGNOSIS — M1A00X0 Idiopathic chronic gout, unspecified site, without tophus (tophi): Secondary | ICD-10-CM | POA: Diagnosis not present

## 2018-10-31 DIAGNOSIS — I129 Hypertensive chronic kidney disease with stage 1 through stage 4 chronic kidney disease, or unspecified chronic kidney disease: Secondary | ICD-10-CM | POA: Diagnosis not present

## 2019-02-02 DIAGNOSIS — D649 Anemia, unspecified: Secondary | ICD-10-CM | POA: Diagnosis not present

## 2019-02-02 DIAGNOSIS — I129 Hypertensive chronic kidney disease with stage 1 through stage 4 chronic kidney disease, or unspecified chronic kidney disease: Secondary | ICD-10-CM | POA: Diagnosis not present

## 2019-02-02 DIAGNOSIS — M1A00X0 Idiopathic chronic gout, unspecified site, without tophus (tophi): Secondary | ICD-10-CM | POA: Diagnosis not present

## 2019-02-02 DIAGNOSIS — N183 Chronic kidney disease, stage 3 unspecified: Secondary | ICD-10-CM | POA: Diagnosis not present

## 2019-02-21 ENCOUNTER — Other Ambulatory Visit: Payer: Self-pay | Admitting: Urology

## 2019-02-21 DIAGNOSIS — C61 Malignant neoplasm of prostate: Secondary | ICD-10-CM

## 2019-03-01 ENCOUNTER — Inpatient Hospital Stay: Payer: PRIVATE HEALTH INSURANCE | Attending: Oncology

## 2019-03-01 ENCOUNTER — Inpatient Hospital Stay (HOSPITAL_BASED_OUTPATIENT_CLINIC_OR_DEPARTMENT_OTHER): Payer: PRIVATE HEALTH INSURANCE | Admitting: Oncology

## 2019-03-01 ENCOUNTER — Other Ambulatory Visit: Payer: Self-pay

## 2019-03-01 ENCOUNTER — Other Ambulatory Visit: Payer: Self-pay | Admitting: *Deleted

## 2019-03-01 VITALS — BP 141/78 | HR 58 | Temp 97.4°F | Resp 17 | Ht 63.0 in | Wt 142.5 lb

## 2019-03-01 DIAGNOSIS — D649 Anemia, unspecified: Secondary | ICD-10-CM | POA: Insufficient documentation

## 2019-03-01 DIAGNOSIS — D693 Immune thrombocytopenic purpura: Secondary | ICD-10-CM | POA: Diagnosis not present

## 2019-03-01 DIAGNOSIS — I10 Essential (primary) hypertension: Secondary | ICD-10-CM | POA: Insufficient documentation

## 2019-03-01 DIAGNOSIS — N289 Disorder of kidney and ureter, unspecified: Secondary | ICD-10-CM | POA: Diagnosis not present

## 2019-03-01 DIAGNOSIS — C61 Malignant neoplasm of prostate: Secondary | ICD-10-CM | POA: Diagnosis not present

## 2019-03-01 LAB — CBC WITH DIFFERENTIAL (CANCER CENTER ONLY)
Abs Immature Granulocytes: 0.03 10*3/uL (ref 0.00–0.07)
Basophils Absolute: 0.1 10*3/uL (ref 0.0–0.1)
Basophils Relative: 1 %
Eosinophils Absolute: 0.3 10*3/uL (ref 0.0–0.5)
Eosinophils Relative: 4 %
HCT: 40 % (ref 39.0–52.0)
Hemoglobin: 13.1 g/dL (ref 13.0–17.0)
Immature Granulocytes: 0 %
Lymphocytes Relative: 43 %
Lymphs Abs: 3.7 10*3/uL (ref 0.7–4.0)
MCH: 30.9 pg (ref 26.0–34.0)
MCHC: 32.8 g/dL (ref 30.0–36.0)
MCV: 94.3 fL (ref 80.0–100.0)
Monocytes Absolute: 0.8 10*3/uL (ref 0.1–1.0)
Monocytes Relative: 9 %
Neutro Abs: 3.8 10*3/uL (ref 1.7–7.7)
Neutrophils Relative %: 43 %
Platelet Count: 235 10*3/uL (ref 150–400)
RBC: 4.24 MIL/uL (ref 4.22–5.81)
RDW: 13.3 % (ref 11.5–15.5)
WBC Count: 8.7 10*3/uL (ref 4.0–10.5)
nRBC: 0 % (ref 0.0–0.2)

## 2019-03-01 NOTE — Progress Notes (Signed)
  [place] OFFICE PROGRESS NOTE   Diagnosis: ITP   INTERVAL HISTORY:   Mr. Drudge returns as scheduled.  He continues to require self catheterization for urination.  He is followed by Dr. Diona Fanti.  No other complaint.  Objective:  Vital signs in last 24 hours:  Blood pressure (!) 141/78, pulse (!) 58, temperature (!) 97.4 F (36.3 C), temperature source Temporal, resp. rate 17, height 5\' 3"  (1.6 m), weight 142 lb 8 oz (64.6 kg), SpO2 100 %.    Limited physical examination secondary to distancing with the Covid pandemic Lymphatics: No cervical, supraclavicular, axillary, or inguinal nodes GI: No hepatosplenomegaly, reducible right inguinal hernia Vascular: No leg edema   Lab Results:  Lab Results  Component Value Date   WBC 8.7 03/01/2019   HGB 13.1 03/01/2019   HCT 40.0 03/01/2019   MCV 94.3 03/01/2019   PLT 235 03/01/2019   NEUTROABS 3.8 03/01/2019    CMP  Lab Results  Component Value Date   NA 141 02/27/2018   K 4.3 02/27/2018   CL 108 02/27/2018   CO2 26 02/27/2018   GLUCOSE 95 02/27/2018   BUN 24 (H) 02/27/2018   CREATININE 1.67 (H) 02/27/2018   CALCIUM 9.2 02/27/2018   PROT 9.0 (H) 02/07/2014   ALBUMIN 2.9 (L) 02/12/2014   AST 23 02/07/2014   ALT 14 02/07/2014   ALKPHOS 62 02/07/2014   BILITOT 0.9 02/07/2014   GFRNONAA 40 (L) 02/27/2018   GFRAA 46 (L) 02/27/2018     Medications: I have reviewed the patient's current medications.   Assessment/Plan: 1. Severe thrombocytopenia, likely secondary to ITP  Status post IVIG 02/08/2014 and 02/09/2014  Steroids initiated 02/08/2014, Decadron 02/10/2014  Started prednisone 02/11/2014  Prednisone decreased to 40 mg daily beginning 02/18/2014  Prednisone decreased to 30 mg daily beginning 02/27/2014  Prednisone decreased to 20 mg daily beginning 03/14/2014  Prednisone decreased to 10 mg daily beginning 03/26/2014  Prednisone decreased to 5 mg daily beginning  04/25/2014  Prednisone decreased to 2.5 mg daily on 05/21/2014  Prednisone tapered to 2.5 mg every other day for 5 doses and then stopped on 06/18/2014  2. Renal insufficiency 3. Hypertension 4. Anemia- mild, most likely secondary to renal insufficiency.  Hemoglobin stable. 5. Elevated PSA 6. Fever on hospital admission December 2015-potentially related to a urinary tract infection, cultures negative. 7. Prostatic hypertrophy/urinary retention. He continues self-catheterization and is followed by urology 8. Prostate cancer 06/24/2017 (left apex Gleason score 3+3 = 6 involves 10% of 1 core; right base lateral Gleason score 3+3 = 6 involves 10% of 1 core; right apex lateral findings suspicious for carcinoma)    Disposition: Mr. Preusser is in remission from ITP.  He is followed by Dr. Diona Fanti for prostate hypertrophy, urinary retention, and prostate cancer.  He will return for an office visit in 1 year.  I encouraged him to obtain the COVID-19 vaccine when available.  [name], MD  03/01/2019  3:46 PM

## 2019-03-05 ENCOUNTER — Telehealth: Payer: Self-pay | Admitting: Oncology

## 2019-03-05 NOTE — Telephone Encounter (Signed)
Scheduled per los. Called and left msg. Mailed printout  °

## 2019-03-26 ENCOUNTER — Other Ambulatory Visit: Payer: Self-pay

## 2019-03-26 ENCOUNTER — Ambulatory Visit
Admission: RE | Admit: 2019-03-26 | Discharge: 2019-03-26 | Disposition: A | Discharge status: Home / Self Care | Payer: Medicare Other | Source: Ambulatory Visit | Attending: Urology | Admitting: Urology

## 2019-03-26 DIAGNOSIS — C61 Malignant neoplasm of prostate: Secondary | ICD-10-CM

## 2019-03-26 IMAGING — MR MR PROSTATE WO/W CM
56 series · 56 of 56 positions shown · IV contrast (6 ML MULTIHANCE)
Comparison: None

CLINICAL DATA: Prostate cancer, elevated PSA to 9.75 on 04/04/2018,
biopsy from 06/24/2017 showing Gleason 6 disease in right lateral
prostate base with atypical glands in the right apex.

EXAM:
MR PROSTATE WITHOUT AND WITH CONTRAST
TECHNIQUE: Multiplanar multisequence MRI images were obtained of the pelvis
centered about the prostate. Pre and post contrast images were
obtained.
CONTRAST:  6mL MULTIHANCE GADOBENATE DIMEGLUMINE 529 MG/ML IV SOLN
Creatinine was obtained on site at [HOSPITAL] at [HOSPITAL].
Results: Creatinine 1.8 mg/dL.

[Series 3: bSSFP fat-sat · axial · 8.0mm · 0.74mm/px · 1 of 28 slices shown]
[im 1/28]
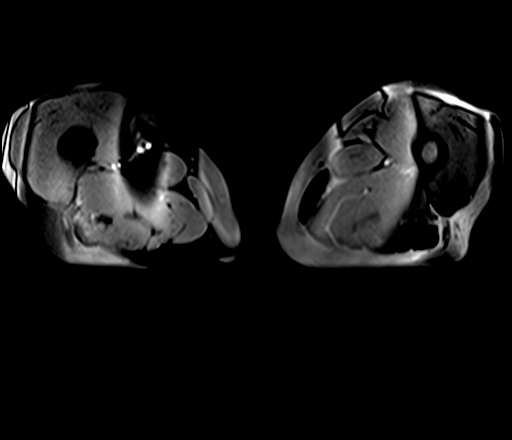

[Series 4: T1 · axial · 5.0mm · 1.25mm/px · 1 of 80 slices shown]
[im 1/80]
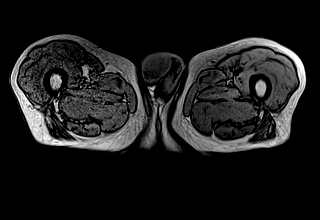

[Series 5: T2 · coronal · 3.5mm · 0.56mm/px · 1 of 23 slices shown (1 of 3)]
[im 1/23]
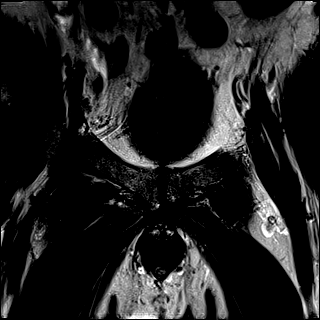

[Series 6: DWI · axial · 3.5mm · 1.75mm/px · 1 of 60 slices shown (1 of 3)]
[im 1/60]
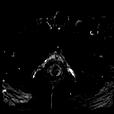

[Series 7: DWI · axial · 3.5mm · 1.75mm/px · 1 of 20 slices shown (2 of 3)]
[im 1/20]
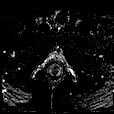

[Series 8: DWI · axial · 3.5mm · 1.56mm/px · 1 of 20 slices shown (3 of 3)]
[im 1/20]
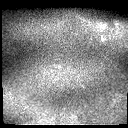

[Series 9: T2 · axial · 3.5mm · 0.56mm/px · 1 of 23 slices shown (2 of 3)]
[im 1/23]
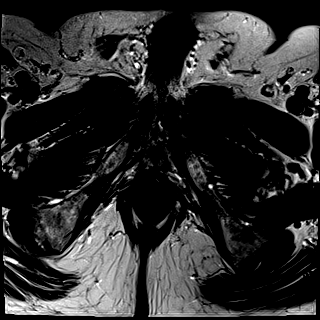

[Series 10: T2 · axial · 1.0mm · 1.04mm/px · 1 of 80 slices shown (3 of 3)]
[im 1/80]
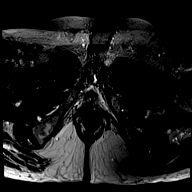

[Series 11: pre t1_twist_tra_dyn_ttc=5.3s · axial · non-contrast · 3.5mm · 0.83mm/px · 1 of 20 slices shown]
[im 1/20]
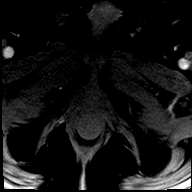

[Series 12: post t1_twist_tra_dyn-copy center · axial · 3.5mm · 0.83mm/px · 1 of 20 slices shown (1 of 24)]
[im 1/20]
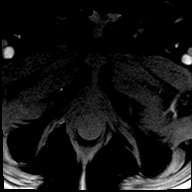

[Series 13: post t1_twist_tra_dyn-copy center · axial · 3.5mm · 0.83mm/px · 1 of 20 slices shown (2 of 24)]
[im 1/20]
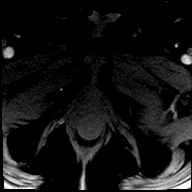

[Series 14: post t1_twist_tra_dyn-copy cent_sub_ttc=(id) · axial · 3.5mm · 0.83mm/px · 1 of 20 slices shown (1 of 23)]
[im 1/20]
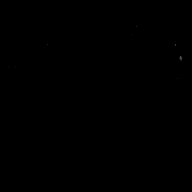

[Series 15: post t1_twist_tra_dyn-copy center · axial · 3.5mm · 0.83mm/px · 1 of 20 slices shown (3 of 24)]
[im 1/20]
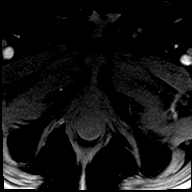

[Series 16: post t1_twist_tra_dyn-copy cent_sub_ttc=(id) · axial · 3.5mm · 0.83mm/px · 1 of 20 slices shown (2 of 23)]
[im 1/20]
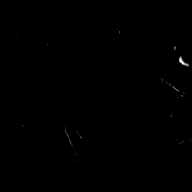

[Series 17: post t1_twist_tra_dyn-copy center · axial · 3.5mm · 0.83mm/px · 1 of 20 slices shown (4 of 24)]
[im 1/20]
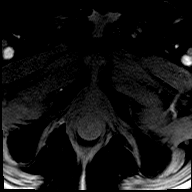

[Series 18: post t1_twist_tra_dyn-copy cent_sub_ttc=(id) · axial · 3.5mm · 0.83mm/px · 1 of 20 slices shown (3 of 23)]
[im 1/20]
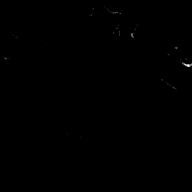

[Series 19: post t1_twist_tra_dyn-copy center · axial · 3.5mm · 0.83mm/px · 1 of 20 slices shown (5 of 24)]
[im 1/20]
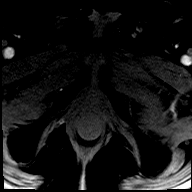

[Series 20: post t1_twist_tra_dyn-copy cent_sub_ttc=(id) · axial · 3.5mm · 0.83mm/px · 1 of 20 slices shown (4 of 23)]
[im 1/20]
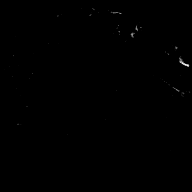

[Series 21: post t1_twist_tra_dyn-copy center · axial · 3.5mm · 0.83mm/px · 1 of 20 slices shown (6 of 24)]
[im 1/20]
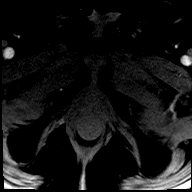

[Series 22: post t1_twist_tra_dyn-copy cent_sub_ttc=(id) · axial · 3.5mm · 0.83mm/px · 1 of 20 slices shown (5 of 23)]
[im 1/20]
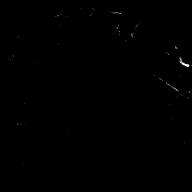

[Series 23: post t1_twist_tra_dyn-copy center · axial · 3.5mm · 0.83mm/px · 1 of 20 slices shown (7 of 24)]
[im 1/20]
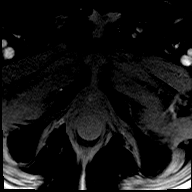

[Series 24: post t1_twist_tra_dyn-copy cent_sub_ttc=(id) · axial · 3.5mm · 0.83mm/px · 1 of 20 slices shown (6 of 23)]
[im 1/20]
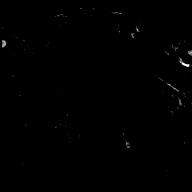

[Series 25: post t1_twist_tra_dyn-copy center · axial · 3.5mm · 0.83mm/px · 1 of 20 slices shown (8 of 24)]
[im 1/20]
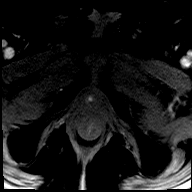

[Series 26: post t1_twist_tra_dyn-copy cent_sub_ttc=(id) · axial · 3.5mm · 0.83mm/px · 1 of 20 slices shown (7 of 23)]
[im 1/20]
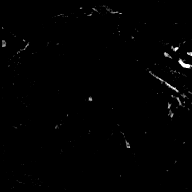

[Series 27: post t1_twist_tra_dyn-copy center · axial · 3.5mm · 0.83mm/px · 1 of 20 slices shown (9 of 24)]
[im 1/20]
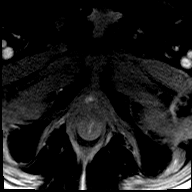

[Series 28: post t1_twist_tra_dyn-copy cent_sub_ttc=(id) · axial · 3.5mm · 0.83mm/px · 1 of 20 slices shown (8 of 23)]
[im 1/20]
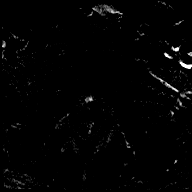

[Series 29: post t1_twist_tra_dyn-copy center · axial · 3.5mm · 0.83mm/px · 1 of 20 slices shown (10 of 24)]
[im 1/20]
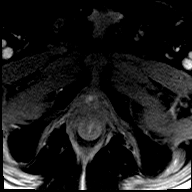

[Series 30: post t1_twist_tra_dyn-copy cent_sub_ttc=(id) · axial · 3.5mm · 0.83mm/px · 1 of 20 slices shown (9 of 23)]
[im 1/20]
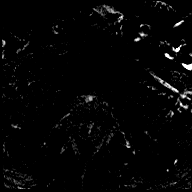

[Series 31: post t1_twist_tra_dyn-copy center · axial · 3.5mm · 0.83mm/px · 1 of 20 slices shown (11 of 24)]
[im 1/20]
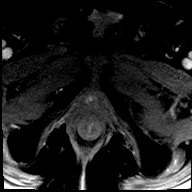

[Series 32: post t1_twist_tra_dyn-copy cent_sub_ttc=(id) · axial · 3.5mm · 0.83mm/px · 1 of 20 slices shown (10 of 23)]
[im 1/20]
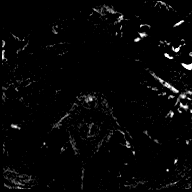

[Series 33: post t1_twist_tra_dyn-copy center · axial · 3.5mm · 0.83mm/px · 1 of 20 slices shown (12 of 24)]
[im 1/20]
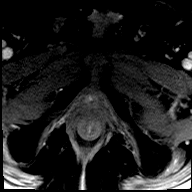

[Series 34: post t1_twist_tra_dyn-copy cent_sub_ttc=(id) · axial · 3.5mm · 0.83mm/px · 1 of 20 slices shown (11 of 23)]
[im 1/20]
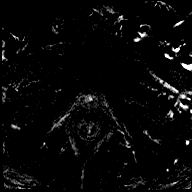

[Series 35: post t1_twist_tra_dyn-copy center · axial · 3.5mm · 0.83mm/px · 1 of 20 slices shown (13 of 24)]
[im 1/20]
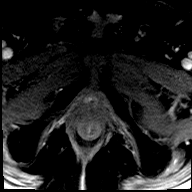

[Series 36: post t1_twist_tra_dyn-copy cent_sub_ttc=(id) · axial · 3.5mm · 0.83mm/px · 1 of 20 slices shown (12 of 23)]
[im 1/20]
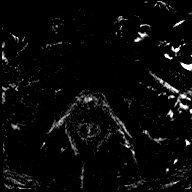

[Series 37: post t1_twist_tra_dyn-copy center · axial · 3.5mm · 0.83mm/px · 1 of 20 slices shown (14 of 24)]
[im 1/20]
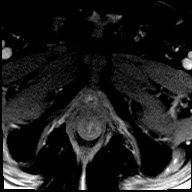

[Series 38: post t1_twist_tra_dyn-copy cent_sub_ttc=(id) · axial · 3.5mm · 0.83mm/px · 1 of 20 slices shown (13 of 23)]
[im 1/20]
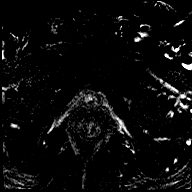

[Series 39: post t1_twist_tra_dyn-copy center · axial · 3.5mm · 0.83mm/px · 1 of 20 slices shown (15 of 24)]
[im 1/20]
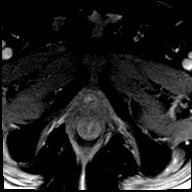

[Series 40: post t1_twist_tra_dyn-copy cent_sub_ttc=(id) · axial · 3.5mm · 0.83mm/px · 1 of 20 slices shown (14 of 23)]
[im 1/20]
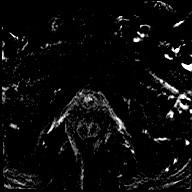

[Series 41: post t1_twist_tra_dyn-copy center · axial · 3.5mm · 0.83mm/px · 1 of 20 slices shown (16 of 24)]
[im 1/20]
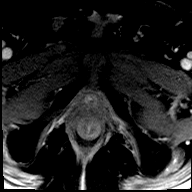

[Series 42: post t1_twist_tra_dyn-copy cent_sub_ttc=(id) · axial · 3.5mm · 0.83mm/px · 1 of 20 slices shown (15 of 23)]
[im 1/20]
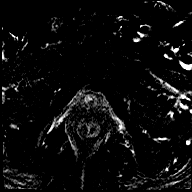

[Series 43: post t1_twist_tra_dyn-copy center · axial · 3.5mm · 0.83mm/px · 1 of 20 slices shown (17 of 24)]
[im 1/20]
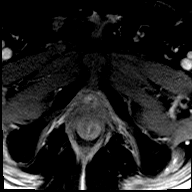

[Series 44: post t1_twist_tra_dyn-copy cent_sub_ttc=(id) · axial · 3.5mm · 0.83mm/px · 1 of 20 slices shown (16 of 23)]
[im 1/20]
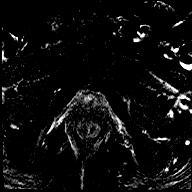

[Series 45: post t1_twist_tra_dyn-copy center · axial · 3.5mm · 0.83mm/px · 1 of 20 slices shown (18 of 24)]
[im 1/20]
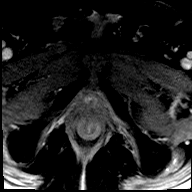

[Series 46: post t1_twist_tra_dyn-copy cent_sub_ttc=(id) · axial · 3.5mm · 0.83mm/px · 1 of 20 slices shown (17 of 23)]
[im 1/20]
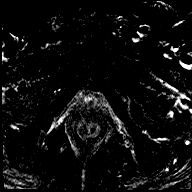

[Series 47: post t1_twist_tra_dyn-copy center · axial · 3.5mm · 0.83mm/px · 1 of 20 slices shown (19 of 24)]
[im 1/20]
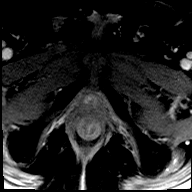

[Series 48: post t1_twist_tra_dyn-copy cent_sub_ttc=(id) · axial · 3.5mm · 0.83mm/px · 1 of 20 slices shown (18 of 23)]
[im 1/20]
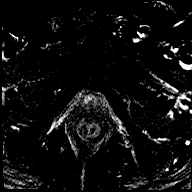

[Series 49: post t1_twist_tra_dyn-copy center · axial · 3.5mm · 0.83mm/px · 1 of 20 slices shown (20 of 24)]
[im 1/20]
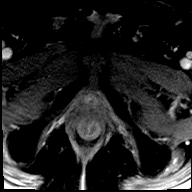

[Series 50: post t1_twist_tra_dyn-copy cent_sub_ttc=(id) · axial · 3.5mm · 0.83mm/px · 1 of 19 slices shown (19 of 23)]
[im 1/19]
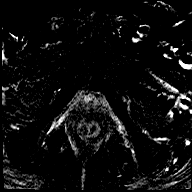

[Series 51: post t1_twist_tra_dyn-copy center · axial · 3.5mm · 0.83mm/px · 1 of 20 slices shown (21 of 24)]
[im 1/20]
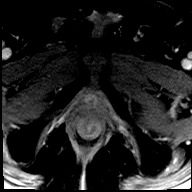

[Series 52: post t1_twist_tra_dyn-copy cent_sub_ttc=(id) · axial · 3.5mm · 0.83mm/px · 1 of 20 slices shown (20 of 23)]
[im 1/20]
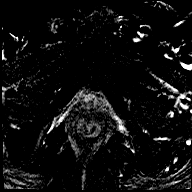

[Series 53: post t1_twist_tra_dyn-copy center · axial · 3.5mm · 0.83mm/px · 1 of 20 slices shown (22 of 24)]
[im 1/20]
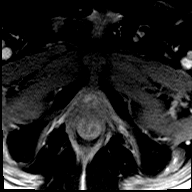

[Series 54: post t1_twist_tra_dyn-copy cent_sub_ttc=(id) · axial · 3.5mm · 0.83mm/px · 1 of 19 slices shown (21 of 23)]
[im 1/19]
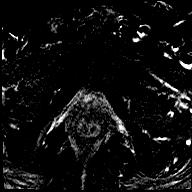

[Series 55: post t1_twist_tra_dyn-copy center · axial · 3.5mm · 0.83mm/px · 1 of 20 slices shown (23 of 24)]
[im 1/20]
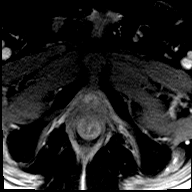

[Series 56: post t1_twist_tra_dyn-copy cent_sub_ttc=(id) · axial · 3.5mm · 0.83mm/px · 1 of 20 slices shown (22 of 23)]
[im 1/20]
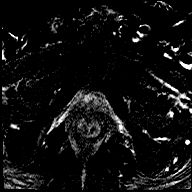

[Series 57: post t1_twist_tra_dyn-copy center · axial · 3.5mm · 0.83mm/px · 1 of 20 slices shown (24 of 24)]
[im 1/20]
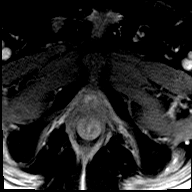

[Series 58: post t1_twist_tra_dyn-copy cent_sub_ttc=(id) · axial · 3.5mm · 0.83mm/px · 1 of 20 slices shown (23 of 23)]
[im 1/20]
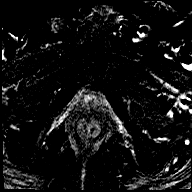

[56 of 56 positions shown; findings below may reference images not displayed]

FINDINGS: Prostate: Signs of BPH and prostatitis. Diffuse low signal
throughout the peripheral zone.

Peripheral zone:

Region 1: More focal area of low signal seen along the left mid
gland peripheral zone on T2 weighted images with lenticular
morphology measuring approximately 8 mm. Mild heterogeneity of
signal in the peripheral zone on high B value diffusion weighted
imaging in this location which is not as focal as the T2 imaging
finding. The ADC is more heterogeneous than expected with a lesion
demonstrated on image 13 of series 7 measuring 7 mm slightly more
anterior than expected for the abnormality seen in the peripheral
zone.

No baseline biopsy related hemorrhage in this location.
Susceptibility artifact suggesting some calcification adjacent to
this area along the surgical capsule.

Signs of early enhancement in this area measuring larger than the
expected size of the lesion in question tracking from left base to
mid gland extending towards but not involving the left apex.

Transitional zone: Heterogeneity of the transitional zone compatible
with BPH

Volume: 27.2 cc

Transcapsular spread:  Absent

Seminal vesicle involvement: Absent

Neurovascular bundle involvement: Absent

Pelvic adenopathy: Absent

Bone metastasis: Absent

Other findings: Asymmetric signal in the left testicle. Suggestion
of small focal lesion though this could be artifact related (image
26, series 3)

Diffuse thickening, circumferential of the urinary bladder in the
setting of BPH.

Small fat containing inguinal hernias.
IMPRESSION: 1. PIRADS category 4 lesion in the left mid gland peripheral zone in
the 3 to 5 o'clock position extending towards the apex as described,
marked and prostate segmented in DynaCAD.
2. Suggestion of small focal lesion in the left testicle,
potentially related artifact. However given the asymmetry and focal
appearance would suggest physical exam and sonographic correlation.
3. Circumferential bladder wall thickening may be related to bladder
outlet obstruction as suggested based on trabeculation.
4. Fat containing bilateral inguinal hernias.

## 2019-03-26 MED ORDER — GADOBENATE DIMEGLUMINE 529 MG/ML IV SOLN
6.0000 mL | Freq: Once | INTRAVENOUS | Status: AC | PRN
  Administered 2019-03-26: 13:00:00 6 mL via INTRAVENOUS

## 2019-04-01 ENCOUNTER — Ambulatory Visit: Payer: PRIVATE HEALTH INSURANCE | Attending: Internal Medicine

## 2019-04-01 DIAGNOSIS — Z23 Encounter for immunization: Secondary | ICD-10-CM | POA: Insufficient documentation

## 2019-04-01 NOTE — Progress Notes (Signed)
   Covid-19 Vaccination Clinic  Name:  Steven Bell    MRN: [id number] DOB: 1945/07/22  04/01/2019  Mr. Ifft was observed post Covid-19 immunization for 15 minutes without incidence. He was provided with Vaccine Information Sheet and instruction to access the V-Safe system.   Mr. Hoggatt was instructed to call 911 with any severe reactions post vaccine: Marland Kitchen Difficulty breathing  . Swelling of your face and throat  . A fast heartbeat  . A bad rash all over your body  . Dizziness and weakness    Immunizations Administered    Name Date Dose VIS Date Route   Pfizer COVID-19 Vaccine 04/01/2019 11:37 AM 0.3 mL 01/26/2019 Intramuscular   Manufacturer: [place]   Lot: [id number]   [place]: [id number]

## 2019-04-06 DIAGNOSIS — R972 Elevated prostate specific antigen [PSA]: Secondary | ICD-10-CM | POA: Diagnosis not present

## 2019-04-16 ENCOUNTER — Encounter: Payer: Self-pay | Admitting: *Deleted

## 2019-04-16 ENCOUNTER — Other Ambulatory Visit: Payer: Self-pay | Admitting: Urology

## 2019-04-16 ENCOUNTER — Other Ambulatory Visit (HOSPITAL_COMMUNITY): Payer: Self-pay | Admitting: Urology

## 2019-04-16 DIAGNOSIS — C61 Malignant neoplasm of prostate: Secondary | ICD-10-CM

## 2019-04-20 DIAGNOSIS — C61 Malignant neoplasm of prostate: Secondary | ICD-10-CM | POA: Diagnosis not present

## 2019-04-22 ENCOUNTER — Ambulatory Visit: Payer: PRIVATE HEALTH INSURANCE | Attending: Internal Medicine

## 2019-04-22 DIAGNOSIS — Z23 Encounter for immunization: Secondary | ICD-10-CM | POA: Insufficient documentation

## 2019-04-22 NOTE — Progress Notes (Signed)
   Covid-19 Vaccination Clinic  Name:  Aayam Tiscareno    MRN: [id number] DOB: 1945-04-01  04/22/2019  Mr. Manalac was observed post Covid-19 immunization for 15 minutes without incident. He was provided with Vaccine Information Sheet and instruction to access the V-Safe system.   Mr. Zepp was instructed to call 911 with any severe reactions post vaccine: Marland Kitchen Difficulty breathing  . Swelling of face and throat  . A fast heartbeat  . A bad rash all over body  . Dizziness and weakness   Immunizations Administered    Name Date Dose VIS Date Route   Pfizer COVID-19 Vaccine 04/22/2019  9:21 AM 0.3 mL 01/26/2019 Intramuscular   Manufacturer: [place]   Lot: [id number]   [facility]: [id number]

## 2019-04-25 ENCOUNTER — Encounter (HOSPITAL_COMMUNITY)
Admission: RE | Admit: 2019-04-25 | Discharge: 2019-04-25 | Disposition: A | Discharge status: Home / Self Care | Payer: PRIVATE HEALTH INSURANCE | Source: Ambulatory Visit | Attending: Urology | Admitting: Urology

## 2019-04-25 ENCOUNTER — Other Ambulatory Visit: Payer: Self-pay

## 2019-04-25 DIAGNOSIS — C61 Malignant neoplasm of prostate: Secondary | ICD-10-CM | POA: Diagnosis not present

## 2019-04-25 IMAGING — NM NM BONE WHOLE BODY
2 series · 2 of 2 positions shown · non-contrast
Comparison: None.  CT abdomen pelvis dated April 20, 2019.

CLINICAL DATA: Prostate cancer. 15.30 PSA level on February 22, 2019.
No bone pain, fracture history, bone surgery or recent trauma.

EXAM:
NUCLEAR MEDICINE WHOLE BODY BONE SCAN
TECHNIQUE: Whole body anterior and posterior images were obtained approximately
3 hours after intravenous injection of radiopharmaceutical.
RADIOPHARMACEUTICALS:  19.2 mCi Fechnetium-FFm MDP IV

[Series 1: whole body · 2.66mm/px · 1 of 1 slices shown (1 of 2)]
[im 1/1]
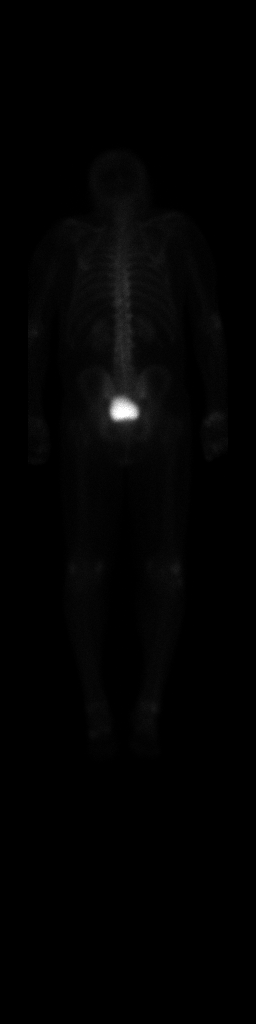

[Series 1: whole body · 2.66mm/px · 1 of 1 slices shown (2 of 2)]
[im 1/1]
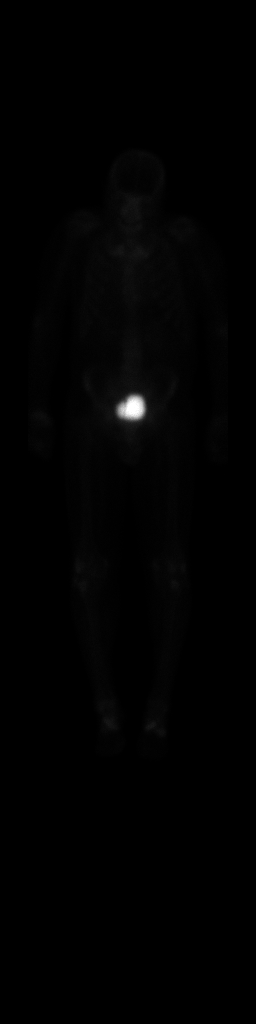

[2 of 2 positions shown; findings below may reference images not displayed]

FINDINGS: No metastatic radiotracer uptake. Focal degenerative uptake at the
left lower costosternal junction, bilateral ankles, knees, wrists,
elbows and shoulders and costomanubrial junctions. Symmetrical low
level radiotracer uptake in the region of the bilateral femoral
heads, in the area of likely bilateral enostoses on recent CT
imaging.
IMPRESSION: No evidence for metastatic skeletal disease.

## 2019-04-25 MED ORDER — TECHNETIUM TC 99M MEDRONATE IV KIT
19.2000 | PACK | Freq: Once | INTRAVENOUS | Status: AC | PRN
  Administered 2019-04-25: 19.2 via INTRAVENOUS

## 2019-04-26 NOTE — Progress Notes (Signed)
GU Location of Tumor / Histology: prostatic adenocarcinoma  If Prostate Cancer, Gleason Score is (4 + 5) and PSA is (15.3). Prostate volume: 27.12 grams  Steven Bell was diagnosed with prostate cancer in May 2019 and opted for active surveillance. Surveillance biopsy revealed aggressive cancer in one core.  Biopsies of prostate (if applicable) revealed:   Past/Anticipated interventions by urology, if any: prostate biopsy, active surveillance, Surveillance MRI biopsy, CT scan, Bone scan (negative), referral to Dr. Tammi Klippel  Past/Anticipated interventions by medical oncology, if any: no  Weight changes, if any: no  Bowel/Bladder complaints, if any: Self catheterizes three- four times per day and reports emptying a large volume without signs of blood or infection. Confirms he has been self catheterizing for years. Denies any bowel complaints.  Nausea/Vomiting, if any: no  Pain issues, if any:  no  SAFETY ISSUES:  Prior radiation? no  Pacemaker/ICD? no  Possible current pregnancy? no, male patient  Is the patient on methotrexate? no  Current Complaints / other details:  74 year old male. Single. Patient reference someone he referred to as Jackelyn Poling today during the consult. Denies any family history of cancer.

## 2019-04-27 ENCOUNTER — Encounter: Payer: Self-pay | Admitting: Urology

## 2019-04-27 ENCOUNTER — Encounter: Payer: Self-pay | Admitting: Radiation Oncology

## 2019-04-27 ENCOUNTER — Ambulatory Visit
Admission: RE | Admit: 2019-04-27 | Discharge: 2019-04-27 | Disposition: A | Discharge status: Home / Self Care | Payer: PRIVATE HEALTH INSURANCE | Source: Ambulatory Visit | Attending: Radiation Oncology | Admitting: Radiation Oncology

## 2019-04-27 ENCOUNTER — Other Ambulatory Visit: Payer: Self-pay

## 2019-04-27 VITALS — Ht 64.0 in | Wt 145.0 lb

## 2019-04-27 DIAGNOSIS — C61 Malignant neoplasm of prostate: Secondary | ICD-10-CM

## 2019-04-27 HISTORY — DX: Malignant neoplasm of prostate: C61

## 2019-04-27 NOTE — Progress Notes (Signed)
Radiation Oncology         [phone] ________________________________  Initial Outpatient Consultation - Conducted via Telephone due to current COVID-19 concerns for limiting patient exposure  Name: Steven Bell MRN: [id number]  Date: 04/27/2019  DOB: Jan 03, 1946  [id number], [name], MD  Franchot Gallo, MD   REFERRING PHYSICIAN: Franchot Gallo, MD  DIAGNOSIS: 74 y.o. gentleman with Stage T1c adenocarcinoma of the prostate with Gleason score of 4+5, and PSA of 15.3.    ICD-10-CM   1. Malignant neoplasm of prostate ([place])  C61     HISTORY OF PRESENT ILLNESS: Steven Bell is a 74 y.o. male with a diagnosis of prostate cancer. He has been followed by Dr. Diona Fanti since 2016 for BPH and retention as well as recurrent prostate infections.  He currently self-catheterizes several times a day and reports he has done his for several years.  His PSA remained elevated at 5.8 following a course of antibiotics in 08/2016 leading to prostate biopsy which was performed on 06/24/2017.  This showed Gleason 3+3 in 10% of 2/12 cores.  After discussion of treatment options, he elected to proceed with active surveillance.   His PSA was noted to be further elevated at 9.75 in 03/2018 and a surveillance MRI prostate was recommended at that time.  In light of the pandemic, he did not return for follow up until 02/2019.  Digital rectal examination was performed at that time revealing no nodules and a repeat PSA was noted further elevated at 15.3.  A prostate MRI was performed on 03/26/2019, which revealed an 8 mm left mid gland peripheral zone lesion (PIRADS 4).  The patient proceeded to MRI fusion biopsy of the prostate on 04/06/2019.  The prostate volume measured 27.12 cc.  Out of 16 core biopsies, 5 were positive.  The maximum Gleason score was 4+5, and this was seen in the left apex lateral. Additionally, a small focus of Gleason 4+3 was seen in one of the MRI ROI samples, a small focus of Gleason 3+4 in the left  apex, and Gleason 3+3 in 2 of the MRI ROI samples.  He underwent abdomen/pelvis CT on 04/20/2019 showing no evidence of metastatic disease but moderate diffuse bladder wall thickening likely secondary to chronic bladder outlet obstruction.  A bone scan on 04/25/2019 showed no evidence of skeletal metastatic disease.   The patient reviewed the biopsy results with his urologist and he has kindly been referred today for discussion of potential radiation treatment options.   PREVIOUS RADIATION THERAPY: No  PAST MEDICAL HISTORY:  Past Medical History:  Diagnosis Date  . BPH (benign prostatic hyperplasia)   . Hyperglycemia   . Hyperlipidemia   . Hypertension   . Kidney problem   . Prostate cancer ([place])       PAST SURGICAL HISTORY: Past Surgical History:  Procedure Laterality Date  . PROSTATE BIOPSY      FAMILY HISTORY:  Family History  Problem Relation Age of Onset  . Breast cancer Neg Hx   . Prostate cancer Neg Hx   . Colon cancer Neg Hx   . Pancreatic cancer Neg Hx     SOCIAL HISTORY:  Social History   Socioeconomic History  . Marital status: Single    Spouse name: Not on file  . Number of children: 1  . Years of education: Not on file  . Highest education level: Not on file  Occupational History    Comment: part time  Tobacco Use  . Smoking status: Never Smoker  .  Smokeless tobacco: Never Used  Substance and Sexual Activity  . Alcohol use: No  . Drug use: No  . Sexual activity: Yes  Other Topics Concern  . Not on file  Social History Narrative  . Not on file   Social Determinants of Health   Financial Resource Strain:   . Difficulty of Paying Living Expenses:   Food Insecurity:   . Worried About Charity fundraiser in the Last Year:   . Arboriculturist in the Last Year:   Transportation Needs:   . Film/video editor (Medical):   Marland Kitchen Lack of Transportation (Non-Medical):   Physical Activity:   . Days of Exercise per Week:   . Minutes of Exercise per  Session:   Stress:   . Feeling of Stress :   Social Connections:   . Frequency of Communication with Friends and Family:   . Frequency of Social Gatherings with Friends and Family:   . Attends Religious Services:   . Active Member of Clubs or Organizations:   . Attends Archivist Meetings:   Marland Kitchen Marital Status:   Intimate Partner Violence:   . Fear of Current or Ex-Partner:   . Emotionally Abused:   Marland Kitchen Physically Abused:   . Sexually Abused:     ALLERGIES: Patient has no known allergies.  MEDICATIONS:  Current Outpatient Medications  Medication Sig Dispense Refill  . allopurinol (ZYLOPRIM) 100 MG tablet Take 100 mg by mouth daily.    Marland Kitchen atorvastatin (LIPITOR) 10 MG tablet Take 10 mg by mouth daily.    . finasteride (PROSCAR) 5 MG tablet Take 5 mg by mouth daily.    Marland Kitchen lisinopril (ZESTRIL) 20 MG tablet Take 20 mg by mouth daily.    . metoprolol succinate (TOPROL-XL) 100 MG 24 hr tablet Take 100 mg by mouth daily. Take with or immediately following a meal.     No current facility-administered medications for this encounter.    REVIEW OF SYSTEMS:  On review of systems, the patient reports that he is doing well overall. He denies any chest pain, shortness of breath, cough, fevers, chills, night sweats, unintended weight changes. He denies any bowel disturbances, and denies abdominal pain, nausea or vomiting. He denies any new musculoskeletal or joint aches or pains. He reports he self-catheterizes 3-4 times per day, which he has been doing for years. He denies any hematuria. A complete review of systems is obtained and is otherwise negative.    PHYSICAL EXAM:  Wt Readings from Last 3 Encounters:  04/27/19 145 lb (65.8 kg)  03/01/19 142 lb 8 oz (64.6 kg)  02/27/18 146 lb 1.6 oz (66.3 kg)   Temp Readings from Last 3 Encounters:  03/01/19 (!) 97.4 F (36.3 C) (Temporal)  02/27/18 97.8 F (36.6 C) (Oral)  02/24/17 97.8 F (36.6 C) (Oral)   BP Readings from Last 3  Encounters:  03/01/19 (!) 141/78  02/27/18 133/73  02/24/17 119/63   Pulse Readings from Last 3 Encounters:  03/01/19 (!) 58  02/27/18 (!) 56  02/24/17 65   Pain Assessment Pain Score: 0-No pain/10  Physical exam not performed in light of telephone consult visit format.   KPS = 90  100 - Normal; no complaints; no evidence of disease. 90   - Able to carry on normal activity; minor signs or symptoms of disease. 80   - Normal activity with effort; some signs or symptoms of disease. 39   - Cares for self; unable to carry on  normal activity or to do active work. 60   - Requires occasional assistance, but is able to care for most of his personal needs. 50   - Requires considerable assistance and frequent medical care. 30   - Disabled; requires special care and assistance. 65   - Severely disabled; hospital admission is indicated although death not imminent. 88   - Very sick; hospital admission necessary; active supportive treatment necessary. 10   - Moribund; fatal processes progressing rapidly. 0     - Dead  Karnofsky DA, Abelmann [place], Craver LS and Burchenal [facility] [phone]) The use of the nitrogen mustards in the palliative treatment of carcinoma: with particular reference to bronchogenic carcinoma Cancer 1 634-56  LABORATORY DATA:  Lab Results  Component Value Date   WBC 8.7 03/01/2019   HGB 13.1 03/01/2019   HCT 40.0 03/01/2019   MCV 94.3 03/01/2019   PLT 235 03/01/2019   Lab Results  Component Value Date   NA 141 02/27/2018   K 4.3 02/27/2018   CL 108 02/27/2018   CO2 26 02/27/2018   Lab Results  Component Value Date   ALT 14 02/07/2014   AST 23 02/07/2014   ALKPHOS 62 02/07/2014   BILITOT 0.9 02/07/2014     RADIOGRAPHY: NM Bone Scan Whole Body  Result Date: 04/25/2019 CLINICAL DATA:  Prostate cancer. 15.30 PSA level on February 22, 2019. No bone pain, fracture history, bone surgery or recent trauma. EXAM: NUCLEAR MEDICINE WHOLE BODY BONE SCAN TECHNIQUE: Whole body  anterior and posterior images were obtained approximately 3 hours after intravenous injection of radiopharmaceutical. RADIOPHARMACEUTICALS:  19.2 mCi Technetium-50m MDP IV COMPARISON:  None.  CT abdomen pelvis dated April 20, 2019. FINDINGS: No metastatic radiotracer uptake. Focal degenerative uptake at the left lower costosternal junction, bilateral ankles, knees, wrists, elbows and shoulders and costomanubrial junctions. Symmetrical low level radiotracer uptake in the region of the bilateral femoral heads, in the area of likely bilateral enostoses on recent CT imaging. IMPRESSION: No evidence for metastatic skeletal disease. Electronically Signed   By: Revonda Humphrey   On: 04/25/2019 21:40      IMPRESSION/PLAN: This visit was conducted via Telephone to spare the patient unnecessary potential exposure in the healthcare setting during the current COVID-19 pandemic. 1. 74 y.o. gentleman with Stage T1c adenocarcinoma of the prostate with Gleason Score of 4+5, and PSA of 15.3. We discussed the patient's workup and outlined the nature of prostate cancer in this setting. The patient's T stage, Gleason's score, and PSA put him into the high risk group. Accordingly, he is eligible for a variety of potential treatment options including prostatectomy or LT-ADT in combination with either 8 weeks of external radiation or 5 weeks of external radiation preceded by a brachytherapy boost. We discussed the available radiation techniques, and focused on the details and logistics of delivery. We discussed and outlined the risks, benefits, short and long-term effects associated with radiotherapy and compared and contrasted these with prostatectomy. We discussed the role of SpaceOAR in reducing the rectal toxicity associated with radiotherapy. We also detailed the role of ADT in the treatment of high risk prostate cancer and outlined the associated side effects that could be expected with this therapy.  He was encouraged to ask  questions that were answered to his stated satisfaction.  At the end of the conversation, the patient is interested in moving forward with LT-ADT concurrent with brachytherapy boost with SpaceOAR gel followed by a 5 week course of daily radiotherapy. He has not  received his first Lupron injection. We will share our discussion with Dr. Diona Fanti and move forward with coordinating follow-up visit to start ADT, first available.  The patient will be contacted in the near future by Romie Jumper in our office who will be working closely with him to coordinate OR scheduling and pre and post procedure appointments in anticipation of moving forward with the seed boost procedure approximately 2 months after the start of ADT.  We will contact the pharmaceutical rep to ensure that [place] is available at the time of procedure.  He will have a prostate MRI following his post-seed CT SIM to confirm appropriate distribution of the [place] and we will move forward with treatment planning in anticipation of beginning a 5-week course of prostate IMRT shortly thereafter.  He appears to have a good understanding of his disease and our treatment recommendations which are of curative intent and he is in agreement with the stated plan.  We will move forward with treatment planning accordingly.  Given current concerns for patient exposure during the COVID-19 pandemic, this encounter was conducted via telephone. The patient was notified in advance and was offered a MyChart meeting to allow for face to face communication but unfortunately reported that he did not have the appropriate resources/technology to support such a visit and instead preferred to proceed with telephone consult. The patient has given verbal consent for this type of encounter. The time spent during this encounter was 50 minutes. The attendants for this meeting include Tyler Pita MD, Ashlyn Bruning PA-C, [place], and patient, Tameka Frankfort  and significant other/family member Debbie. During the encounter, Tyler Pita MD, Ashlyn Bruning PA-C, and scribe, Wilburn Mylar were located at [facility].  Patient, Steven Bell and Jackelyn Poling were located at home.    Nicholos Johns, PA-C    Tyler Pita, MD  [facility] Direct Dial: [phone]  Fax: [phone] [email or web address]  Skype  LinkedIn  This document serves as a record of services personally performed by Tyler Pita, MD and Freeman Caldron, PA-C. It was created on their behalf by Wilburn Mylar, a trained medical scribe. The creation of this record is based on the scribe's personal observations and the provider's statements to them. This document has been checked and approved by the attending provider.

## 2019-04-27 NOTE — Progress Notes (Signed)
See progress note under physician encounter. 

## 2019-05-01 ENCOUNTER — Telehealth: Payer: Self-pay | Admitting: *Deleted

## 2019-05-01 NOTE — Telephone Encounter (Signed)
Called patient to inform of ADT appt. for 05-30-19 @ 9:15 am @ Dr. Alan Ripper Office, spoke with patient and he is aware of this appt.

## 2019-05-07 ENCOUNTER — Telehealth: Payer: Self-pay | Admitting: Medical Oncology

## 2019-05-07 NOTE — Telephone Encounter (Signed)
Spoke with patient to introduce myself as the prostate nurse navigator and discuss my role. He has chosen ADT/seed boost and 5 week of radiation as treatment. He is scheduled 3/23 for hormone injection and 5/20 for CT simulation. He confirmed these appointments. I have him my contact information and asked him to call me with questions or concerns. He voiced understanding.

## 2019-05-08 DIAGNOSIS — C61 Malignant neoplasm of prostate: Secondary | ICD-10-CM | POA: Diagnosis not present

## 2019-05-08 DIAGNOSIS — Z5111 Encounter for antineoplastic chemotherapy: Secondary | ICD-10-CM | POA: Diagnosis not present

## 2019-05-14 ENCOUNTER — Encounter: Payer: Self-pay | Admitting: Medical Oncology

## 2019-05-15 ENCOUNTER — Other Ambulatory Visit: Payer: Self-pay | Admitting: Urology

## 2019-05-15 ENCOUNTER — Telehealth: Payer: Self-pay | Admitting: *Deleted

## 2019-05-15 NOTE — Telephone Encounter (Signed)
CALLED PATIENT TO INFORM OF PRE-SEED APPTS. AND IMPLANT DATE, SPOKE WITH PATIENT AND HE IS AWARE OF THESE APPTS. 

## 2019-07-04 ENCOUNTER — Telehealth: Payer: Self-pay | Admitting: *Deleted

## 2019-07-04 NOTE — Telephone Encounter (Signed)
CALLED PATIENT TO REMIND OF PRE-SEED APPTS. FOR 07-05-19, SPOKE WITH PATIENT AND HE IS AWARE OF THESE APPTS.

## 2019-07-05 ENCOUNTER — Encounter: Payer: Self-pay | Admitting: Urology

## 2019-07-05 ENCOUNTER — Ambulatory Visit (HOSPITAL_COMMUNITY)
Admission: RE | Admit: 2019-07-05 | Discharge: 2019-07-05 | Disposition: A | Discharge status: Home / Self Care | Payer: PRIVATE HEALTH INSURANCE | Source: Ambulatory Visit | Attending: Urology | Admitting: Urology

## 2019-07-05 ENCOUNTER — Ambulatory Visit
Admission: RE | Admit: 2019-07-05 | Discharge: 2019-07-05 | Disposition: A | Discharge status: Home / Self Care | Payer: PRIVATE HEALTH INSURANCE | Source: Ambulatory Visit | Attending: Radiation Oncology | Admitting: Radiation Oncology

## 2019-07-05 ENCOUNTER — Encounter: Payer: Self-pay | Admitting: Medical Oncology

## 2019-07-05 ENCOUNTER — Other Ambulatory Visit: Payer: Self-pay

## 2019-07-05 ENCOUNTER — Ambulatory Visit
Admission: RE | Admit: 2019-07-05 | Discharge: 2019-07-05 | Disposition: A | Discharge status: Home / Self Care | Payer: Medicare Other | Source: Ambulatory Visit | Attending: Urology | Admitting: Urology

## 2019-07-05 ENCOUNTER — Other Ambulatory Visit: Payer: Self-pay | Admitting: Urology

## 2019-07-05 ENCOUNTER — Encounter (HOSPITAL_COMMUNITY)
Admission: RE | Admit: 2019-07-05 | Discharge: 2019-07-05 | Disposition: A | Discharge status: Home / Self Care | Payer: PRIVATE HEALTH INSURANCE | Source: Ambulatory Visit | Attending: Urology | Admitting: Urology

## 2019-07-05 DIAGNOSIS — C61 Malignant neoplasm of prostate: Secondary | ICD-10-CM

## 2019-07-05 DIAGNOSIS — Z01818 Encounter for other preprocedural examination: Secondary | ICD-10-CM | POA: Diagnosis present

## 2019-07-05 IMAGING — CR DG CHEST 2V
2 series · 2 of 2 positions shown · non-contrast
Comparison: None.

CLINICAL DATA: Preoperative assessment prior to radioactive seed
placement.

EXAM:
CHEST - 2 VIEW

[w chest pa]
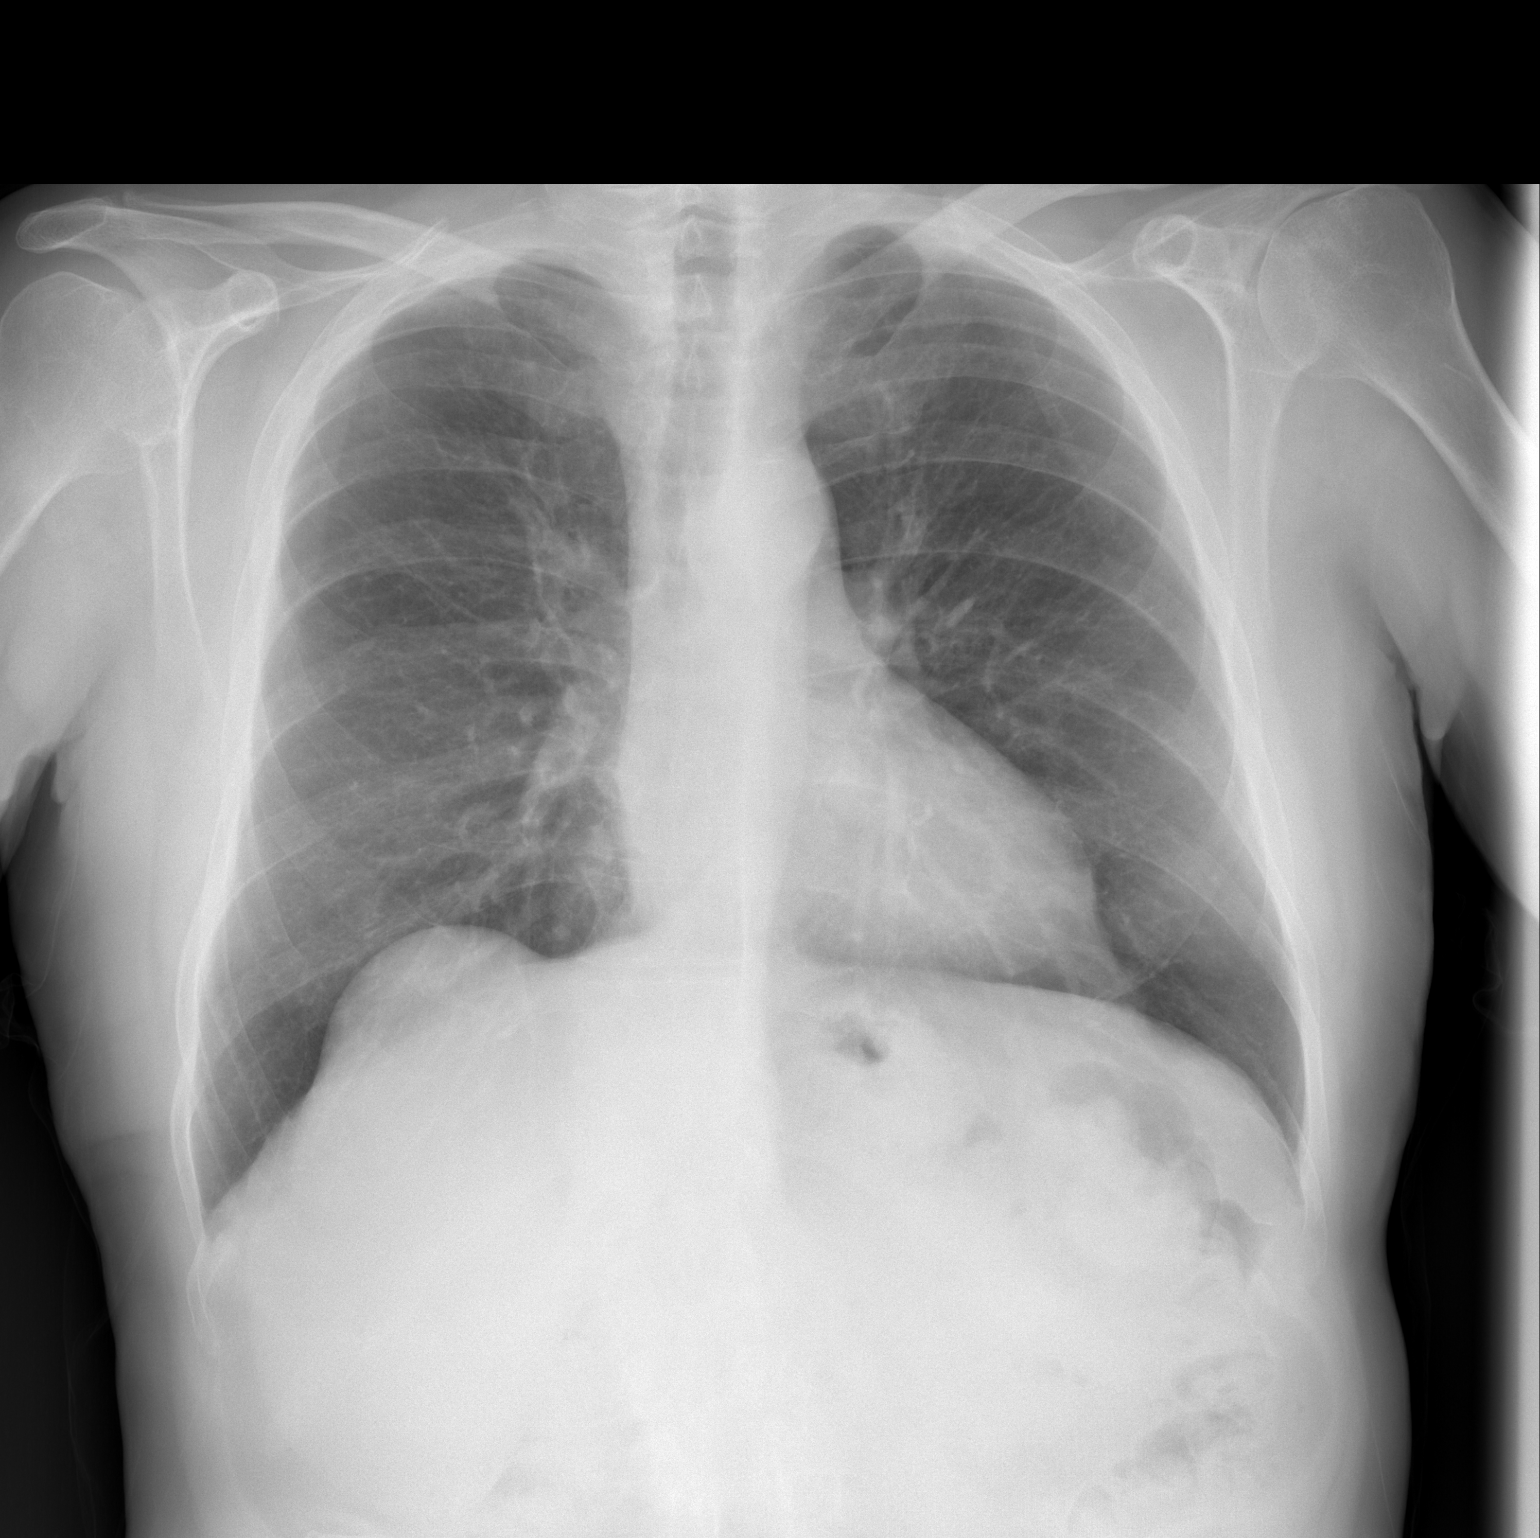

[w chest lat]
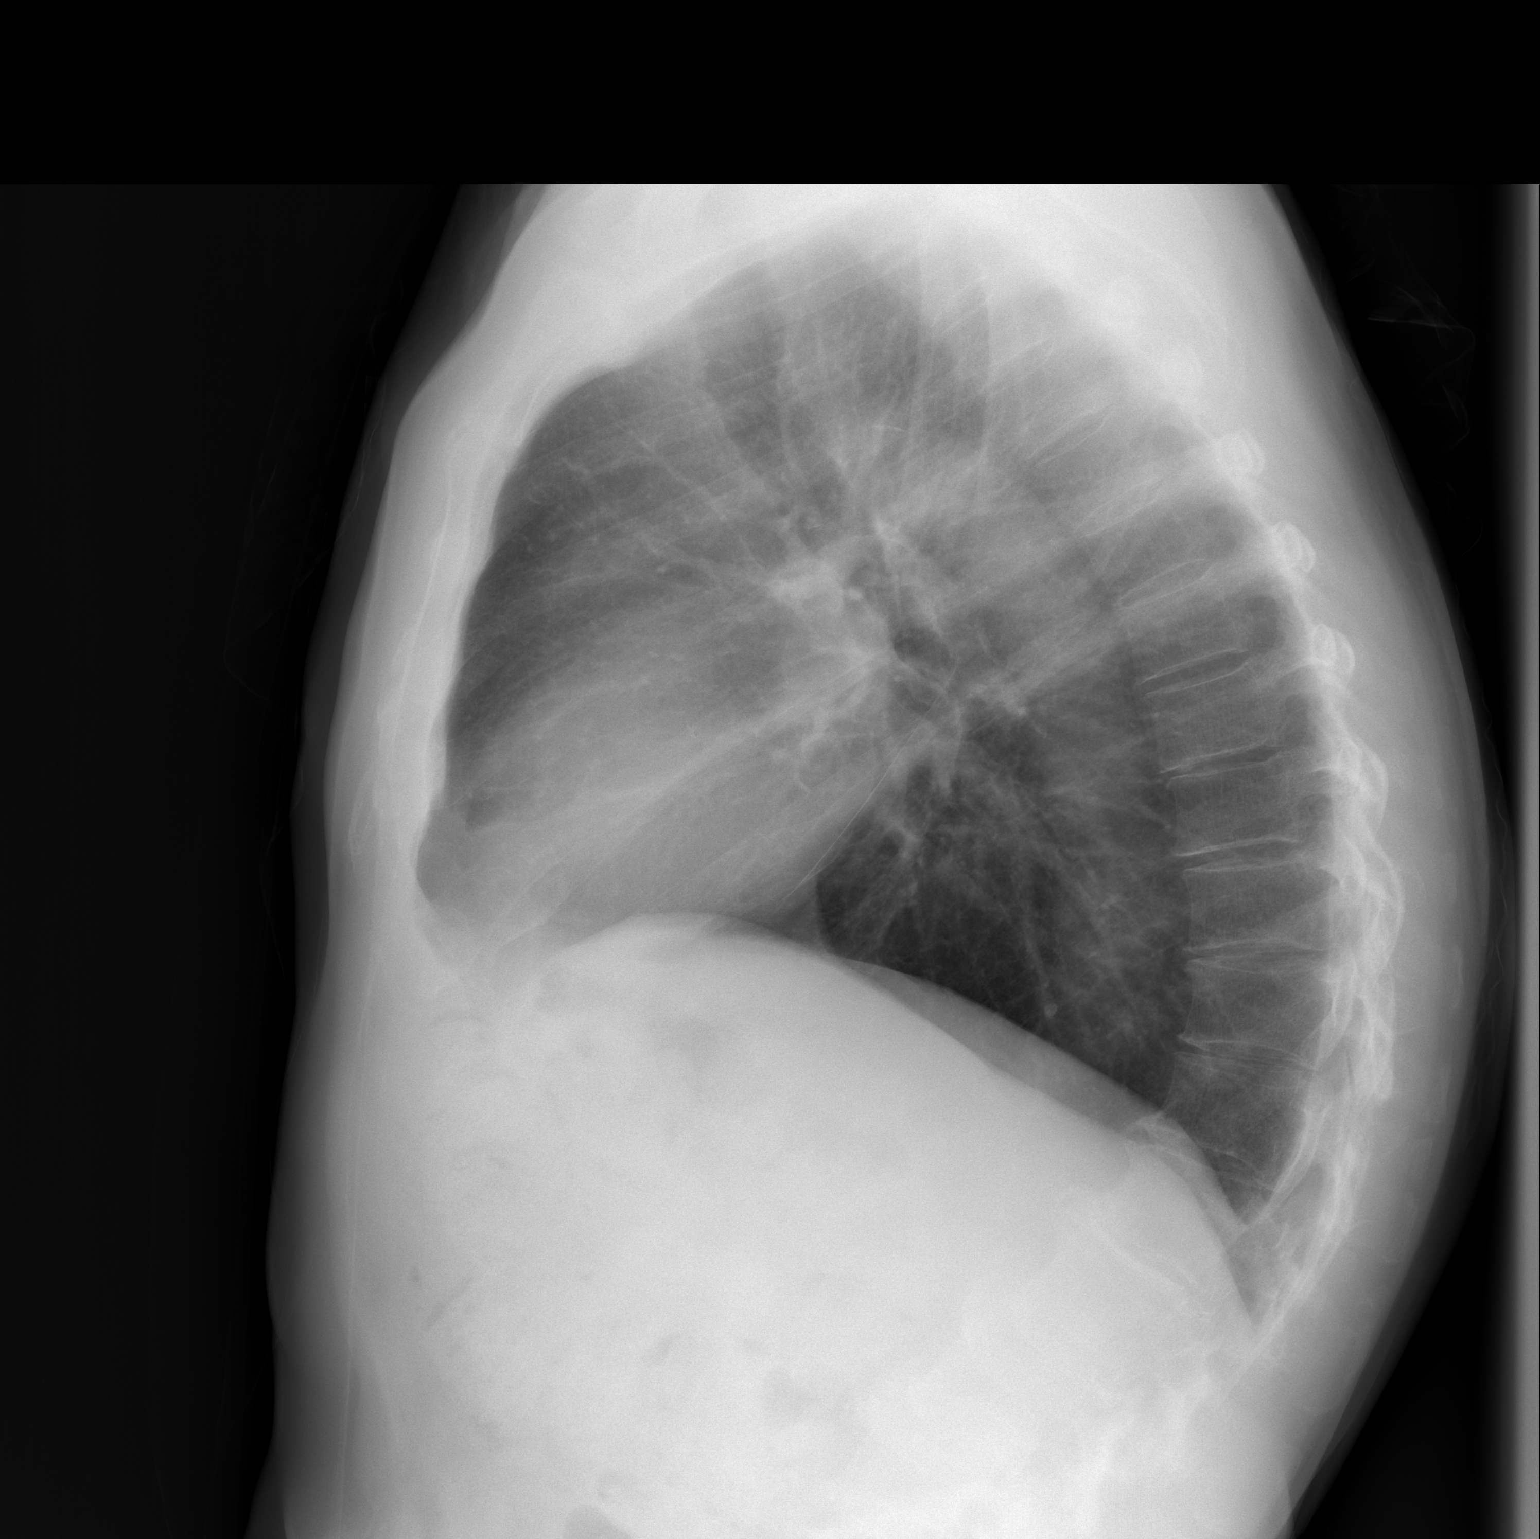

[2 of 2 positions shown; findings below may reference images not displayed]

FINDINGS: The heart size and mediastinal contours are within normal limits.
There is eventration of the right hemidiaphragm. Both lungs are
clear. The visualized skeletal structures are unremarkable.
IMPRESSION: No active cardiopulmonary disease.

## 2019-07-05 NOTE — Progress Notes (Signed)
Patient in facility for Post Seed appointment denies any pain or discomfort at this time. Has foley catheter intact so the AUA questions does not apply. Has had foley for about 5 years. No hematuria noted in urine. Follow up appointment with urologist is in August.

## 2019-07-08 NOTE — Progress Notes (Signed)
  Radiation Oncology         [phone]) [phone] ________________________________  Name: Steven Bell. MRN: [id number]  Date: 07/05/2019  DOB: [birth year]  SIMULATION AND TREATMENT PLANNING NOTE PUBIC ARCH STUDY  [id number], [name], MD  Franchot Gallo, MD  DIAGNOSIS:  Oncology History  Malignant neoplasm of prostate ([place])  04/06/2019 Cancer Staging   Staging form: Prostate, AJCC 8th Edition - Clinical stage from 04/06/2019: Stage IIIC (cT1c, cN0, cM0, PSA: 15.3, Grade Group: 5) - Signed by Freeman Caldron, PA-C on 04/27/2019   04/27/2019 Initial Diagnosis   Malignant neoplasm of prostate ([place])       ICD-10-CM   1. Malignant neoplasm of prostate ([place])  C61     COMPLEX SIMULATION:  The patient presented today for evaluation for possible prostate seed implant. He was brought to the radiation planning suite and placed supine on the CT couch. A 3-dimensional image study set was obtained in upload to the planning computer. There, on each axial slice, I contoured the prostate gland. Then, using three-dimensional radiation planning tools I reconstructed the prostate in view of the structures from the transperineal needle pathway to assess for possible pubic arch interference. In doing so, I did not appreciate any pubic arch interference. Also, the patient's prostate volume was estimated based on the drawn structure. The volume was 31 cc.  Given the pubic arch appearance and prostate volume, patient remains a good candidate to proceed with prostate seed implant. Today, he freely provided informed written consent to proceed.    PLAN: The patient will undergo prostate seed implant.   ________________________________  Sheral Apley. Tammi Klippel, M.D.

## 2019-07-23 ENCOUNTER — Other Ambulatory Visit: Payer: Self-pay

## 2019-07-23 ENCOUNTER — Encounter (HOSPITAL_COMMUNITY)
Admission: RE | Admit: 2019-07-23 | Discharge: 2019-07-23 | Disposition: A | Discharge status: Home / Self Care | Payer: Medicare Other | Source: Ambulatory Visit | Attending: Urology | Admitting: Urology

## 2019-07-23 DIAGNOSIS — Z01812 Encounter for preprocedural laboratory examination: Secondary | ICD-10-CM | POA: Diagnosis not present

## 2019-07-23 LAB — CBC
HCT: 35.8 % — ABNORMAL LOW (ref 39.0–52.0)
Hemoglobin: 11.4 g/dL — ABNORMAL LOW (ref 13.0–17.0)
MCH: 30.4 pg (ref 26.0–34.0)
MCHC: 31.8 g/dL (ref 30.0–36.0)
MCV: 95.5 fL (ref 80.0–100.0)
Platelets: 205 10*3/uL (ref 150–400)
RBC: 3.75 MIL/uL — ABNORMAL LOW (ref 4.22–5.81)
RDW: 13.2 % (ref 11.5–15.5)
WBC: 6.6 10*3/uL (ref 4.0–10.5)
nRBC: 0 % (ref 0.0–0.2)

## 2019-07-23 LAB — COMPREHENSIVE METABOLIC PANEL
ALT: 28 U/L (ref 0–44)
AST: 29 U/L (ref 15–41)
Albumin: 3.9 g/dL (ref 3.5–5.0)
Alkaline Phosphatase: 66 U/L (ref 38–126)
Anion gap: 10 (ref 5–15)
BUN: 32 mg/dL — ABNORMAL HIGH (ref 8–23)
CO2: 23 mmol/L (ref 22–32)
Calcium: 9.2 mg/dL (ref 8.9–10.3)
Chloride: 108 mmol/L (ref 98–111)
Creatinine, Ser: 1.61 mg/dL — ABNORMAL HIGH (ref 0.61–1.24)
GFR calc Af Amer: 48 mL/min — ABNORMAL LOW (ref >60)
GFR calc non Af Amer: 41 mL/min — ABNORMAL LOW (ref >60)
Glucose, Bld: 118 mg/dL — ABNORMAL HIGH (ref 70–99)
Potassium: 3.6 mmol/L (ref 3.5–5.1)
Sodium: 141 mmol/L (ref 135–145)
Total Bilirubin: 0.6 mg/dL (ref 0.3–1.2)
Total Protein: 7.1 g/dL (ref 6.5–8.1)

## 2019-07-23 LAB — PROTIME-INR
INR: 0.9 (ref 0.8–1.2)
Prothrombin Time: 11.8 seconds (ref 11.4–15.2)

## 2019-07-23 LAB — APTT: aPTT: 26 seconds (ref 24–36)

## 2019-07-25 ENCOUNTER — Telehealth: Payer: Self-pay | Admitting: *Deleted

## 2019-07-25 NOTE — Telephone Encounter (Signed)
CALLED PATIENT TO REMIND OF COVID TESTING ON 07-28-19, LVM FOR A RETURN CALL

## 2019-07-26 ENCOUNTER — Other Ambulatory Visit: Payer: Self-pay

## 2019-07-26 ENCOUNTER — Encounter (HOSPITAL_BASED_OUTPATIENT_CLINIC_OR_DEPARTMENT_OTHER): Payer: Self-pay | Admitting: Urology

## 2019-07-26 NOTE — Progress Notes (Signed)
Spoke w/ via phone for pre-op interview--- PT Lab needs dos---- no              Lab results------ CBC, CMP, PT/PTT, done 07-25-2019 results in epic;  Current ekg / cxr results in epic/ chart COVID test ------ 07-28-2019 @ 0925 Arrive at ------- 1100 NPO after ------ MN w/ exception clear liquids until 0700 then nothing by mouth (no cream/milk products) pt verbalized understanding this includes not dip tobacco after 0700 Medications to take morning of surgery ----- Lipitor, Toprol, Allopurinol w/ sips of water Diabetic medication ----- n/a Patient Special Instructions ----- will do one fleet enema morning of surgery Pre-Op special Istructions ----- pt self cath's daily at home, pt bringing his own catheter's dos Patient verbalized understanding of instructions that were given at this phone interview. Patient denies shortness of breath, chest pain, fever, cough a this phone interview.   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    PCP: Dr Kathyrn Lass Oncologist:  Dr Benay Spice (lov 03-01-2019 epic) Cardiologist : no Chest x-ray : 07-05-2019 epic EKG : 07-05-2019 epic Echo : no Stress test:  Pt stated never had one Cardiac Cath :  no Sleep Study/ CPAP : NO Fasting Blood Sugar :      / Checks Blood Sugar -- times a day:   N/A Blood Thinner/ Instructions /Last Dose: NO ASA / Instructions/ Last Dose :  NO

## 2019-07-27 ENCOUNTER — Telehealth: Payer: Self-pay | Admitting: *Deleted

## 2019-07-27 NOTE — Telephone Encounter (Signed)
CALLED PATIENT TO ASK IF IT WOULD BE OK IF A STUDENT WATCHED HIS IMPLANT, PATIENT AGREED TO THIS

## 2019-07-28 ENCOUNTER — Other Ambulatory Visit (HOSPITAL_COMMUNITY)
Admission: RE | Admit: 2019-07-28 | Discharge: 2019-07-28 | Disposition: A | Discharge status: Home / Self Care | Payer: PRIVATE HEALTH INSURANCE | Source: Ambulatory Visit | Attending: Urology | Admitting: Urology

## 2019-07-28 DIAGNOSIS — Z20822 Contact with and (suspected) exposure to covid-19: Secondary | ICD-10-CM | POA: Insufficient documentation

## 2019-07-28 DIAGNOSIS — Z01812 Encounter for preprocedural laboratory examination: Secondary | ICD-10-CM | POA: Diagnosis present

## 2019-07-28 LAB — SARS CORONAVIRUS 2 (TAT 6-24 HRS): SARS Coronavirus 2: NEGATIVE

## 2019-07-31 ENCOUNTER — Telehealth: Payer: Self-pay | Admitting: *Deleted

## 2019-07-31 NOTE — Telephone Encounter (Signed)
CALLED PATIENT TO REMIND OF PROCEDURE FOR 08-01-19, SPOKE WITH PATIENT AND HE IS AWARE OF THIS PROCEDURE

## 2019-08-01 ENCOUNTER — Encounter (HOSPITAL_BASED_OUTPATIENT_CLINIC_OR_DEPARTMENT_OTHER): Payer: Self-pay | Admitting: Urology

## 2019-08-01 ENCOUNTER — Ambulatory Visit (HOSPITAL_BASED_OUTPATIENT_CLINIC_OR_DEPARTMENT_OTHER): Payer: No Typology Code available for payment source | Admitting: Certified Registered"

## 2019-08-01 ENCOUNTER — Other Ambulatory Visit: Payer: Self-pay

## 2019-08-01 ENCOUNTER — Encounter (HOSPITAL_BASED_OUTPATIENT_CLINIC_OR_DEPARTMENT_OTHER)
Admission: RE | Disposition: A | Discharge status: Home / Self Care | Payer: Self-pay | Source: Home / Self Care | Attending: Urology

## 2019-08-01 ENCOUNTER — Ambulatory Visit (HOSPITAL_COMMUNITY): Payer: No Typology Code available for payment source

## 2019-08-01 ENCOUNTER — Ambulatory Visit (HOSPITAL_BASED_OUTPATIENT_CLINIC_OR_DEPARTMENT_OTHER)
Admission: RE | Admit: 2019-08-01 | Discharge: 2019-08-01 | Disposition: A | Discharge status: Home / Self Care | Payer: No Typology Code available for payment source | Attending: Urology | Admitting: Urology

## 2019-08-01 DIAGNOSIS — I129 Hypertensive chronic kidney disease with stage 1 through stage 4 chronic kidney disease, or unspecified chronic kidney disease: Secondary | ICD-10-CM | POA: Diagnosis not present

## 2019-08-01 DIAGNOSIS — D693 Immune thrombocytopenic purpura: Secondary | ICD-10-CM | POA: Diagnosis not present

## 2019-08-01 DIAGNOSIS — C61 Malignant neoplasm of prostate: Secondary | ICD-10-CM | POA: Insufficient documentation

## 2019-08-01 DIAGNOSIS — N183 Chronic kidney disease, stage 3 unspecified: Secondary | ICD-10-CM | POA: Insufficient documentation

## 2019-08-01 HISTORY — DX: Personal history of other diseases of male genital organs: Z87.438

## 2019-08-01 HISTORY — PX: RADIOACTIVE SEED IMPLANT: SHX5150

## 2019-08-01 HISTORY — DX: Male erectile dysfunction, unspecified: N52.9

## 2019-08-01 HISTORY — DX: Immune thrombocytopenic purpura: D69.3

## 2019-08-01 HISTORY — DX: Idiopathic chronic gout, unspecified site, without tophus (tophi): M1A.00X0

## 2019-08-01 HISTORY — DX: Chronic kidney disease, stage 3 unspecified: N18.30

## 2019-08-01 HISTORY — DX: Retention of urine, unspecified: R33.9

## 2019-08-01 HISTORY — DX: Other specified health status: Z78.9

## 2019-08-01 HISTORY — DX: Presence of spectacles and contact lenses: Z97.3

## 2019-08-01 HISTORY — PX: SPACE OAR INSTILLATION: SHX6769

## 2019-08-01 HISTORY — DX: Anemia, unspecified: D64.9

## 2019-08-01 SURGERY — INSERTION, RADIATION SOURCE, PROSTATE
Anesthesia: General | Site: Prostate

## 2019-08-01 MED ORDER — OXYCODONE HCL 5 MG/5ML PO SOLN: 5.0000 mg | Freq: Once | ORAL | Status: DC | PRN

## 2019-08-01 MED ORDER — ONDANSETRON HCL 4 MG/2ML IJ SOLN: 4.0000 mg | Freq: Once | INTRAMUSCULAR | Status: DC | PRN

## 2019-08-01 MED ORDER — FENTANYL CITRATE (PF) 100 MCG/2ML IJ SOLN: 25.0000 ug | INTRAMUSCULAR | Status: DC | PRN

## 2019-08-01 MED ORDER — FENTANYL CITRATE (PF) 100 MCG/2ML IJ SOLN
INTRAMUSCULAR | Status: DC | PRN
  Administered 2019-08-01: 50 ug via INTRAVENOUS
  Administered 2019-08-01: 25 ug via INTRAVENOUS
  Administered 2019-08-01: 50 ug via INTRAVENOUS
  Administered 2019-08-01: 25 ug via INTRAVENOUS

## 2019-08-01 MED ORDER — SODIUM CHLORIDE 0.9 % IV SOLN
INTRAVENOUS | Status: AC | PRN
  Administered 2019-08-01: 1000 mL

## 2019-08-01 MED ORDER — FLEET ENEMA 7-19 GM/118ML RE ENEM: 1.0000 | ENEMA | Freq: Once | RECTAL | Status: DC

## 2019-08-01 MED ORDER — FENTANYL CITRATE (PF) 100 MCG/2ML IJ SOLN
INTRAMUSCULAR | Status: AC
  Filled 2019-08-01: qty 2

## 2019-08-01 MED ORDER — CEFAZOLIN SODIUM-DEXTROSE 2-4 GM/100ML-% IV SOLN
INTRAVENOUS | Status: AC
  Filled 2019-08-01: qty 100

## 2019-08-01 MED ORDER — IOHEXOL 300 MG/ML  SOLN
INTRAMUSCULAR | Status: DC | PRN
  Administered 2019-08-01: 7 mL via URETHRAL

## 2019-08-01 MED ORDER — CEFAZOLIN SODIUM-DEXTROSE 2-4 GM/100ML-% IV SOLN
2.0000 g | Freq: Once | INTRAVENOUS | Status: AC
  Administered 2019-08-01: 2 g via INTRAVENOUS

## 2019-08-01 MED ORDER — EPHEDRINE SULFATE-NACL 50-0.9 MG/10ML-% IV SOSY
PREFILLED_SYRINGE | INTRAVENOUS | Status: DC | PRN
  Administered 2019-08-01 (×2): 10 mg via INTRAVENOUS

## 2019-08-01 MED ORDER — ACETAMINOPHEN 325 MG PO TABS: 325.0000 mg | ORAL_TABLET | ORAL | Status: DC | PRN

## 2019-08-01 MED ORDER — ACETAMINOPHEN 160 MG/5ML PO SOLN: 325.0000 mg | ORAL | Status: DC | PRN

## 2019-08-01 MED ORDER — LIDOCAINE 2% (20 MG/ML) 5 ML SYRINGE
INTRAMUSCULAR | Status: DC | PRN
  Administered 2019-08-01: 100 mg via INTRAVENOUS

## 2019-08-01 MED ORDER — SODIUM CHLORIDE 0.9 % IV SOLN
INTRAVENOUS | Status: DC
  Administered 2019-08-01: 50 mL via INTRAVENOUS

## 2019-08-01 MED ORDER — OXYCODONE HCL 5 MG PO TABS: 5.0000 mg | ORAL_TABLET | Freq: Once | ORAL | Status: DC | PRN

## 2019-08-01 MED ORDER — DEXAMETHASONE SODIUM PHOSPHATE 4 MG/ML IJ SOLN
INTRAMUSCULAR | Status: DC | PRN
  Administered 2019-08-01: 6 mg via INTRAVENOUS

## 2019-08-01 MED ORDER — PROPOFOL 10 MG/ML IV BOLUS
INTRAVENOUS | Status: AC
  Filled 2019-08-01: qty 20

## 2019-08-01 MED ORDER — LIDOCAINE 2% (20 MG/ML) 5 ML SYRINGE
INTRAMUSCULAR | Status: AC
  Filled 2019-08-01: qty 5

## 2019-08-01 MED ORDER — ONDANSETRON HCL 4 MG/2ML IJ SOLN
INTRAMUSCULAR | Status: AC
  Filled 2019-08-01: qty 2

## 2019-08-01 MED ORDER — PROPOFOL 10 MG/ML IV BOLUS
INTRAVENOUS | Status: DC | PRN
  Administered 2019-08-01: 150 mg via INTRAVENOUS

## 2019-08-01 MED ORDER — DEXAMETHASONE SODIUM PHOSPHATE 10 MG/ML IJ SOLN
INTRAMUSCULAR | Status: AC
  Filled 2019-08-01: qty 1

## 2019-08-01 MED ORDER — ONDANSETRON HCL 4 MG/2ML IJ SOLN
INTRAMUSCULAR | Status: DC | PRN
  Administered 2019-08-01: 4 mg via INTRAVENOUS

## 2019-08-01 MED ORDER — ACETAMINOPHEN 10 MG/ML IV SOLN: 1000.0000 mg | Freq: Once | INTRAVENOUS | Status: DC | PRN

## 2019-08-01 MED ORDER — MEPERIDINE HCL 25 MG/ML IJ SOLN: 6.2500 mg | INTRAMUSCULAR | Status: DC | PRN

## 2019-08-01 SURGICAL SUPPLY — 39 items
BAG URINE DRAIN 2000ML AR STRL (UROLOGICAL SUPPLIES) ×4 IMPLANT
BLADE CLIPPER SENSICLIP SURGIC (BLADE) ×4 IMPLANT
CATH FOLEY 2WAY SLVR  5CC 16FR (CATHETERS) ×4
CATH FOLEY 2WAY SLVR 5CC 16FR (CATHETERS) ×2 IMPLANT
CATH ROBINSON RED A/P 16FR (CATHETERS) IMPLANT
CATH ROBINSON RED A/P 20FR (CATHETERS) ×4 IMPLANT
CLOTH BEACON ORANGE TIMEOUT ST (SAFETY) ×4 IMPLANT
CNTNR URN SCR LID CUP LEK RST (MISCELLANEOUS) ×4 IMPLANT
CONT SPEC 4OZ STRL OR WHT (MISCELLANEOUS) ×8
COVER BACK TABLE 60X90IN (DRAPES) ×4 IMPLANT
COVER MAYO STAND STRL (DRAPES) ×4 IMPLANT
DRSG TEGADERM 4X4.75 (GAUZE/BANDAGES/DRESSINGS) ×4 IMPLANT
DRSG TEGADERM 8X12 (GAUZE/BANDAGES/DRESSINGS) ×4 IMPLANT
GAUZE SPONGE 4X4 12PLY STRL LF (GAUZE/BANDAGES/DRESSINGS) ×4 IMPLANT
GLOVE BIO SURGEON STRL SZ 6.5 (GLOVE) ×6 IMPLANT
GLOVE BIO SURGEON STRL SZ7 (GLOVE) ×4 IMPLANT
GLOVE BIO SURGEON STRL SZ7.5 (GLOVE) ×4 IMPLANT
GLOVE BIO SURGEON STRL SZ8 (GLOVE) ×8 IMPLANT
GLOVE BIO SURGEONS STRL SZ 6.5 (GLOVE) ×2
GLOVE BIOGEL PI IND STRL 7.5 (GLOVE) ×2 IMPLANT
GLOVE BIOGEL PI INDICATOR 7.5 (GLOVE) ×2
GLOVE SURG ORTHO 8.5 STRL (GLOVE) ×4 IMPLANT
GLOVE SURG SS PI 6.5 STRL IVOR (GLOVE) IMPLANT
GOWN STRL REUS W/TWL LRG LVL3 (GOWN DISPOSABLE) ×12 IMPLANT
GOWN STRL REUS W/TWL XL LVL3 (GOWN DISPOSABLE) ×8 IMPLANT
HOLDER FOLEY CATH W/STRAP (MISCELLANEOUS) ×4 IMPLANT
IMPL SPACEOAR VUE SYSTEM (Spacer) ×2 IMPLANT
IMPLANT SPACEOAR VUE SYSTEM (Spacer) ×4 IMPLANT
IV NS 1000ML (IV SOLUTION) ×4
IV NS 1000ML BAXH (IV SOLUTION) ×2 IMPLANT
KIT TURNOVER CYSTO (KITS) ×4 IMPLANT
MARKER SKIN DUAL TIP RULER LAB (MISCELLANEOUS) ×4 IMPLANT
SUT BONE WAX W31G (SUTURE) IMPLANT
SYR 10ML LL (SYRINGE) ×4 IMPLANT
TOWEL OR 17X26 10 PK STRL BLUE (TOWEL DISPOSABLE) ×4 IMPLANT
TRAY CYSTO PACK (CUSTOM PROCEDURE TRAY) ×4 IMPLANT
UNDERPAD 30X30 (UNDERPADS AND DIAPERS) ×8 IMPLANT
WATER STERILE IRR 500ML POUR (IV SOLUTION) ×4 IMPLANT
radioactive seed ×264 IMPLANT

## 2019-08-01 NOTE — Transfer of Care (Signed)
Immediate Anesthesia Transfer of Care Note  Patient: Steven Bell.  Procedure(s) Performed: RADIOACTIVE SEED IMPLANT/BRACHYTHERAPY IMPLANT (N/A ) SPACE OAR INSTILLATION (N/A )  Patient Location: PACU  Anesthesia Type:General  Level of Consciousness: awake  Airway & Oxygen Therapy: Patient Spontanous Breathing and Patient connected to face mask oxygen  Post-op Assessment: Report given to RN and Post -op Vital signs reviewed and stable  Post vital signs: Reviewed and stable  Last Vitals:  Vitals Value Taken Time  BP    Temp    Pulse 90 08/01/19 1425  Resp 15 08/01/19 1425  SpO2 100 % 08/01/19 1425  Vitals shown include unvalidated device data.  Last Pain:  Vitals:   08/01/19 1115  TempSrc: Oral  PainSc: 0-No pain      Patients Stated Pain Goal: 4 ([id number])  Complications: No complications documented.

## 2019-08-01 NOTE — Discharge Instructions (Signed)
Radioactive Seed Implant Home Care Instructions ° ° °Activity:    Rest for the remainder of the day.  Do not drive or operate equipment today.  You may resume normal  activities in a few days as instructed by your physician, without risk of harmful radiation exposure to those around you, provided you follow the time and distance precautions on the Radiation Oncology Instruction Sheet. ° ° °Meals: Drink plenty of lipuids and eat light foods, such as gelatin or soup this evening .  You may return to normal meal plan tomorrow. ° °Return °To Work: You may return to work as instructed by your physician. ° °Special °Instruction:   If any seeds are found, use tweezers to pick up seeds and place in a glass container of any kind and bring to your physician's office. ° °Call your physician if any of these symptoms occur: ° °· Persistent or heavy bleeding °· Urine stream diminishes or stops completely after catheter is removed °· Fever equal to or greater than 101 degrees F °· Cloudy urine with a strong foul odor °· Severe pain ° °You may feel some burning pain and/or hesitancy when you urinate after the catheter is removed.  These symptoms may increase over the next few weeks, but should diminish within forur to six weeks.  Applying moist heat to the lower abdomen or a hot tub bath may help relieve the pain.  If the discomfort becomes severe, please call your physician for additional medications. ° ° °Post Anesthesia Home Care Instructions ° °Activity: °Get plenty of rest for the remainder of the day. A responsible individual must stay with you for 24 hours following the procedure.  °For the next 24 hours, DO NOT: °-Drive a car °-Operate machinery °-Drink alcoholic beverages °-Take any medication unless instructed by your physician °-Make any legal decisions or sign important papers. ° °Meals: °Start with liquid foods such as gelatin or soup. Progress to regular foods as tolerated. Avoid greasy, spicy, heavy foods. If nausea  and/or vomiting occur, drink only clear liquids until the nausea and/or vomiting subsides. Call your physician if vomiting continues. ° °Special Instructions/Symptoms: °Your throat may feel dry or sore from the anesthesia or the breathing tube placed in your throat during surgery. If this causes discomfort, gargle with warm salt water. The discomfort should disappear within 24 hours. ° °   ° ° ° ° ° °

## 2019-08-01 NOTE — Hospital Course (Signed)
Preoperative diagnosis: Clinical stage TI C adenocarcinoma the prostate   Postoperative diagnosis: Same   Procedure: I-125 prostate seed implantation, SpaceOAR placement, flexible cystoscopy  Surgeon: Lillette Boxer. Kambrie Eddleman M.D.  Radiation Oncologist: Tyler Pita, M.D.  Anesthesia: Gen.   Indications: Patient  was diagnosed with clinical stage TI C prostate cancer. We had extensive discussion with him about treatment options versus. He elected to proceed with seed implantation/EBRT. He underwent consultation my office as well as with Dr. Tammi Klippel. He appeared to understand the advantages disadvantages potential risks of this treatment option. Full informed consent has been obtained.   Technique and findings: Patient was brought the operating room where he had successful induction of general anesthesia. He was placed in dorso-lithotomy position and prepped and draped in usual manner. Appropriate surgical timeout was performed. Radiation oncology department placed a transrectal ultrasound probe anchoring stand. Foley catheter with contrast in the balloon was inserted without difficulty. Anchoring needles were placed within the prostate. Rectal tube was placed. Real-time contouring of the urethra prostate and rectum were performed and the dosing parameters were established. Targeted dose was 1110 gray.  I was then called  to the operating suite suite for placement of the needles. A second timeout was performed. All needle passage was done with real-time transrectal ultrasound guidance with the sagittal plane. A total of 20 needles were placed.  66 active seeds were implanted.  I then proceeded with placement of SpaceOAR by introducing a needle with the bevel angled inferiorly approximately 2 cm superior to the anus. This was angled downward and under direct ultrasound was placed within the space between the prostatic capsule and rectum. This was confirmed with a small amount of sterile saline injected  and this was performed under direct ultrasound. I then attached the SpaceOAR to the needle and injected this in the space between the prostate and rectum with good placement noted. The Foley catheter was removed and flexible cystoscopy failed to show any seeds outside the prostate.  The patient was brought to recovery room in stable condition, having tolerated the procedure well.Marland Kitchen

## 2019-08-01 NOTE — Interval H&P Note (Signed)
History and Physical Interval Note:  08/01/2019 12:37 PM  Steven Bell.  has presented today for surgery, with the diagnosis of PROSTATE CANCER.  The various methods of treatment have been discussed with the patient and family. After consideration of risks, benefits and other options for treatment, the patient has consented to  Procedure(s) with comments: RADIOACTIVE SEED IMPLANT/BRACHYTHERAPY IMPLANT (N/A) - 90 MINS SPACE OAR INSTILLATION (N/A) as a surgical intervention.  The patient's history has been reviewed, patient examined, no change in status, stable for surgery.  I have reviewed the patient's chart and labs.  Questions were answered to the patient's satisfaction.     Lillette Boxer Sadira Standard

## 2019-08-01 NOTE — H&P (Signed)
H&P  Chief Complaint: Prostate cancer  History of Present Illness:  74 year old male presents today for brachytherapy/Space OAR placement in advance of eventual external beam radiotherapy.  His history is as follows:  He underwent TRUS/Bx on 5.10.2019. PSA 5.68, Prostate volume 28 ml, PSAD 0.20. 2/12 cores positive, both with GS 3+3 pattern (10% of each core). He decided on active surveillance.   1.6.2021: Most recent PSA 9.75 (2.10.2020). He has not had his scheduled MRI for fear of exposure to COVID. He denies any changes in urinary sx's and is overall feeling well.   2.19.2021: TRUS/Bx.   2/12 cores positive for gleason 4+5 and 3+4   4.16.2021: Here today for 4 mo lupron. Initiating radiotherapy with SpaceOar/fiducial marker placement   Past Medical History:  Diagnosis Date  . Anemia   . BPH (benign prostatic hyperplasia)   . Chronic idiopathic gout without tophus    07-26-2019  pt stated last episode left knee 07/ 2019  . Chronic ITP (idiopathic thrombocytopenia) [facility]) oncologist--- dr Benay Spice--- lov note in epic, stated pt in remission   dx 12/ 2015;  had IVIG x2 12/ 2015 and completed prednisone 05/ 2016  . CKD (chronic kidney disease), stage III   . ED (erectile dysfunction)   . History of prostatitis    recurrent  . Hyperlipidemia   . Hypertension    followed by pcp   (07-26-2019  pt stated never had a   . Kidney problem   . Prostate cancer [facility]) urologist--- dr Diona Fanti;  oncologist--- dr Tammi Klippel   dx 05/ 2019 active survillance;  MRI fusion bx 04-06-2019, T3a, Gleason 4+5, PSA 15.3  . Self-catheterizes urinary bladder    per pt 3 -- 4 times daily and prn  . Urinary retention with incomplete bladder emptying    chronic  . Wears glasses     Past Surgical History:  Procedure Laterality Date  . COLONOSCOPY  2005  . PROSTATE BIOPSY    . TONSILLECTOMY  age 74    Home Medications:  Allergies as of 08/01/2019   No Known Allergies     Medication List    Notice    Cannot display discharge medications because the patient has not yet been admitted.     Allergies: No Known Allergies  Family History  Problem Relation Age of Onset  . Breast cancer Neg Hx   . Prostate cancer Neg Hx   . Colon cancer Neg Hx   . Pancreatic cancer Neg Hx     Social History:  reports that he has never smoked. His smokeless tobacco use includes snuff. He reports current alcohol use. He reports that he does not use drugs.  ROS: A complete review of systems was performed.  All systems are negative except for pertinent findings as noted.  Physical Exam:  Vital signs in last 24 hours: Ht 5\' 4"  (1.626 m)   Wt 66.2 kg   BMI 25.06 kg/m  Constitutional:  Alert and oriented, No acute distress Cardiovascular: Regular rate  Respiratory: Normal respiratory effort GI: Abdomen is soft, nontender, nondistended, no abdominal masses. No CVAT.  Genitourinary: Normal male phallus, testes are descended bilaterally and non-tender and without masses, scrotum is normal in appearance without lesions or masses, perineum is normal on inspection. Lymphatic: No lymphadenopathy Neurologic: Grossly intact, no focal deficits Psychiatric: Normal mood and affect  Laboratory Data:  No results for input(s): WBC, HGB, HCT, PLT in the last 72 hours.  No results for input(s): NA, K, CL, GLUCOSE, BUN, CALCIUM,  CREATININE in the last 72 hours.  Invalid input(s): CO3   No results found for this or any previous visit (from the past 24 hour(s)). Recent Results (from the past 240 hour(s))  SARS CORONAVIRUS 2 (TAT 6-24 HRS) Nasopharyngeal Nasopharyngeal Swab     Status: None   Collection Time: 07/28/19  9:17 AM   Specimen: Nasopharyngeal Swab  Result Value Ref Range Status   SARS Coronavirus 2 NEGATIVE NEGATIVE Final    Comment: (NOTE) SARS-CoV-2 target nucleic acids are NOT DETECTED.  The SARS-CoV-2 RNA is generally detectable in upper and lower respiratory specimens during the acute phase of  infection. Negative results do not preclude SARS-CoV-2 infection, do not rule out co-infections with other pathogens, and should not be used as the sole basis for treatment or other patient management decisions. Negative results must be combined with clinical observations, patient history, and epidemiological information. The expected result is Negative.  Fact Sheet for Patients: SugarRoll.be  Fact Sheet for Healthcare Providers: https://www.woods-mathews.com/  This test is not yet approved or cleared by the [place] FDA and  has been authorized for detection and/or diagnosis of SARS-CoV-2 by FDA under an Emergency Use Authorization (EUA). This EUA will remain  in effect (meaning this test can be used) for the duration of the COVID-19 declaration under Se ction 564(b)(1) of the Act, 21 U.S.C. section 360bbb-3(b)(1), unless the authorization is terminated or revoked sooner.  Performed at [facility], [place] [address], [place], [place] [address]     Renal Function: No results for input(s): CREATININE in the last 168 hours. Estimated Creatinine Clearance: 33.2 mL/min (A) (by C-G formula based on SCr of 1.61 mg/dL (H)).  Radiologic Imaging: No results found.  Impression/Assessment:  Prostate cancer, high risk group.  He has initiated long-term androgen deprivation therapy  Plan:   1.  I 125 brachytherapy and Space OAR placement  2.  Followed by eventual external beam radiotherapy

## 2019-08-01 NOTE — Anesthesia Preprocedure Evaluation (Signed)
Anesthesia Evaluation  Patient identified by MRN, date of birth, ID band Patient awake    Reviewed: Allergy & Precautions, NPO status , Patient's Chart, lab work & pertinent test results, reviewed documented beta blocker date and time   Airway Mallampati: I       Dental no notable dental hx. (+) Teeth Intact   Pulmonary neg pulmonary ROS,    Pulmonary exam normal breath sounds clear to auscultation       Cardiovascular hypertension, Pt. on medications and Pt. on home beta blockers negative cardio ROS Normal cardiovascular exam Rhythm:Regular Rate:Normal     Neuro/Psych negative neurological ROS  negative psych ROS   GI/Hepatic negative GI ROS, Neg liver ROS,   Endo/Other  negative endocrine ROS  Renal/GU Renal InsufficiencyRenal disease  negative genitourinary   Musculoskeletal negative musculoskeletal ROS (+)   Abdominal Normal abdominal exam  (+)   Peds negative pediatric ROS (+)  Hematology  (+) anemia ,   Anesthesia Other Findings   Reproductive/Obstetrics negative OB ROS                             Anesthesia Physical Anesthesia Plan  ASA: II  Anesthesia Plan: General   Post-op Pain Management:    Induction: Intravenous  PONV Risk Score and Plan: 3 and Ondansetron, Dexamethasone and Treatment may vary due to age or medical condition  Airway Management Planned: LMA  Additional Equipment: None  Intra-op Plan:   Post-operative Plan: Extubation in OR  Informed Consent: I have reviewed the patients History and Physical, chart, labs and discussed the procedure including the risks, benefits and alternatives for the proposed anesthesia with the patient or authorized representative who has indicated his/her understanding and acceptance.     Dental advisory given  Plan Discussed with: CRNA  Anesthesia Plan Comments:         Anesthesia Quick Evaluation

## 2019-08-01 NOTE — Anesthesia Postprocedure Evaluation (Signed)
Anesthesia Post Note  Patient: Steven Bell.  Procedure(s) Performed: RADIOACTIVE SEED IMPLANT/BRACHYTHERAPY IMPLANT (N/A ) SPACE OAR INSTILLATION (N/A Prostate)     Patient location during evaluation: PACU Anesthesia Type: General Level of consciousness: sedated and awake Pain management: pain level controlled Vital Signs Assessment: post-procedure vital signs reviewed and stable Respiratory status: spontaneous breathing Cardiovascular status: stable Postop Assessment: no apparent nausea or vomiting Anesthetic complications: no   No complications documented.  Last Vitals:  Vitals:   08/01/19 1435 08/01/19 1445  BP:  114/64  Pulse: 77 72  Resp: 15 18  Temp:    SpO2: 100% 99%    Last Pain:  Vitals:   08/01/19 1445  TempSrc:   PainSc: 3    Pain Goal: Patients Stated Pain Goal: 4 (08/01/19 1445)                 Huston Foley

## 2019-08-01 NOTE — Anesthesia Procedure Notes (Signed)
Procedure Name: LMA Insertion Date/Time: 08/01/2019 1:15 PM Performed by: Eulas Post, Darby Shadwick W, CRNA Pre-anesthesia Checklist: Patient identified, Emergency Drugs available, Suction available and Patient being monitored Patient Re-evaluated:Patient Re-evaluated prior to induction Oxygen Delivery Method: Circle system utilized Preoxygenation: Pre-oxygenation with 100% oxygen Induction Type: IV induction Ventilation: Mask ventilation without difficulty LMA: LMA inserted LMA Size: 4.0 Number of attempts: 1 Placement Confirmation: positive ETCO2 and breath sounds checked- equal and bilateral Tube secured with: Tape Dental Injury: Teeth and Oropharynx as per pre-operative assessment

## 2019-08-01 NOTE — Op Note (Signed)
Preoperative diagnosis: Clinical stage TI C adenocarcinoma the prostate   Postoperative diagnosis: Same   Procedure: I-125 prostate seed implantation, placement of SpaceOAR, flexible cystoscopy  Surgeon: Lillette Boxer. Shanterica Biehler M.D.  Radiation Oncologist: Tyler Pita, M.D.  Anesthesia: Gen.   Indications: Patient  was diagnosed with clinical stage TI C prostate cancer. We had extensive discussion with him about treatment options versus. He elected to proceed with combination radiotherapy. He underwent consultation my office as well as with Dr. Tammi Klippel. He appeared to understand the advantages disadvantages potential risks of this treatment option. Full informed consent has been obtained.   Technique and findings: Patient was brought the operating room where he had successful induction of general anesthesia. He was placed in dorso-lithotomy position and prepped and draped in usual manner. Appropriate surgical timeout was performed. Radiation oncology department placed a transrectal ultrasound probe anchoring stand. Foley catheter with contrast in the balloon was inserted without difficulty. Anchoring needles were placed within the prostate. Rectal tube was placed. Real-time contouring of the urethra prostate and rectum were performed and the dosing parameters were established. Targeted dose was 110 gray.  I was then called  to the operating suite suite for placement of the needles. A second timeout was performed. All needle passage was done with real-time transrectal ultrasound guidance with the sagittal plane. A total of 20 needles were placed.  66 active seeds were implanted.  I then proceeded with placement of SpaceOAR by introducing a needle with the bevel angled inferiorly approximately 2 cm superior to the anus. This was angled downward and under direct ultrasound was placed within the space between the prostatic capsule and rectum. This was confirmed with a small amount of sterile saline  injected and this was performed under direct ultrasound. I then attached the SpaceOAR to the needle and injected this in the space between the prostate and rectum with good placement noted. The Foley catheter was removed and flexible cystoscopy failed to show any seeds outside the prostate.  The patient was brought to recovery room in stable condition, having tolerated the procedure well.Marland Kitchen

## 2019-08-02 ENCOUNTER — Encounter (HOSPITAL_BASED_OUTPATIENT_CLINIC_OR_DEPARTMENT_OTHER): Payer: Self-pay | Admitting: Urology

## 2019-08-05 NOTE — Progress Notes (Signed)
  Radiation Oncology         [phone]) [phone] ________________________________  Name: Steven Bell. MRN: [id number]  Date: 08/05/2019  DOB: Aug 27, 1945       Prostate Seed Implant  RT:MYTRZN, [name], MD  No ref. provider found  DIAGNOSIS:  Oncology History  Malignant neoplasm of prostate ([facility])  04/06/2019 Cancer Staging   Staging form: Prostate, AJCC 8th Edition - Clinical stage from 04/06/2019: Stage IIIC (cT1c, cN0, cM0, PSA: 15.3, Grade Group: 5) - Signed by Freeman Caldron, PA-C on 04/27/2019   04/27/2019 Initial Diagnosis   Malignant neoplasm of prostate ([facility])     No diagnosis found.  PROCEDURE: Insertion of radioactive I-125 seeds into the prostate gland.  RADIATION DOSE: 110 Gy, boost therapy.  TECHNIQUE: Steven Bell. was brought to the operating room with the urologist. He was placed in the dorsolithotomy position. He was catheterized and a rectal tube was inserted. The perineum was shaved, prepped and draped. The ultrasound probe was then introduced into the rectum to see the prostate gland.  TREATMENT DEVICE: A needle grid was attached to the ultrasound probe stand and anchor needles were placed.  3D PLANNING: The prostate was imaged in 3D using a sagittal sweep of the prostate probe. These images were transferred to the planning computer. There, the prostate, urethra and rectum were defined on each axial reconstructed image. Then, the software created an optimized 3D plan and a few seed positions were adjusted. The quality of the plan was reviewed using [facility] information for the target and the following two organs at risk:  Urethra and Rectum.  Then the accepted plan was printed and handed off to the radiation therapist.  Under my supervision, the custom loading of the seeds and spacers was carried out and loaded into sealed vicryl sleeves.  These pre-loaded needles were then placed into the needle holder.Marland Kitchen  PROSTATE VOLUME STUDY:  Using transrectal ultrasound the volume of  the prostate was verified to be 30.1 cc.  SPECIAL TREATMENT PROCEDURE/SUPERVISION AND HANDLING: The pre-loaded needles were then delivered under sagittal guidance. A total of 20 needles were used to deposit 66 seeds in the prostate gland. The individual seed activity was 0.303 mCi.  SpaceOAR:  Yes, VUE  COMPLEX SIMULATION: At the end of the procedure, an anterior radiograph of the pelvis was obtained to document seed positioning and count. Cystoscopy was performed to check the urethra and bladder.  MICRODOSIMETRY: At the end of the procedure, the patient was emitting 0.152 mR/hr at 1 meter. Accordingly, he was considered safe for hospital discharge.  PLAN: The patient will return to the radiation oncology clinic for post implant CT dosimetry in three weeks.   ________________________________  Sheral Apley Tammi Klippel, M.D.

## 2019-08-23 ENCOUNTER — Telehealth: Payer: Self-pay | Admitting: *Deleted

## 2019-08-23 NOTE — Telephone Encounter (Signed)
Called patient to inform of appt. for July 13 - arrival time- 1:15 pm @ [facility],lvm for a return call

## 2019-08-24 ENCOUNTER — Ambulatory Visit: Payer: PRIVATE HEALTH INSURANCE | Admitting: Radiation Oncology

## 2019-08-27 ENCOUNTER — Telehealth: Payer: Self-pay | Admitting: *Deleted

## 2019-08-27 NOTE — Telephone Encounter (Signed)
CALLED PATIENT TO REMIND OF SIM APPT. FOR 08-28-19 - ARRIVAL TIME- 1:15 PM @ [facility], LVM FOR A RETURN CALL

## 2019-08-28 ENCOUNTER — Encounter: Payer: Self-pay | Admitting: Medical Oncology

## 2019-08-28 ENCOUNTER — Ambulatory Visit
Admission: RE | Admit: 2019-08-28 | Discharge: 2019-08-28 | Disposition: A | Discharge status: Home / Self Care | Payer: PRIVATE HEALTH INSURANCE | Source: Ambulatory Visit | Attending: Radiation Oncology | Admitting: Radiation Oncology

## 2019-08-28 ENCOUNTER — Other Ambulatory Visit: Payer: Self-pay

## 2019-08-28 DIAGNOSIS — C61 Malignant neoplasm of prostate: Secondary | ICD-10-CM | POA: Diagnosis not present

## 2019-08-28 NOTE — Progress Notes (Signed)
°  Radiation Oncology         [phone]) [phone] ________________________________  Name: Steven Bell. MRN: [id number]  Date: 08/28/2019  DOB: [birth year]  SIMULATION AND TREATMENT PLANNING NOTE    ICD-10-CM   1. Malignant neoplasm of prostate ([facility])  C61     DIAGNOSIS:  74 y.o. gentleman with Stage T1c adenocarcinoma of the prostate with Gleason score of 4+5, and PSA of 15.3  NARRATIVE:  The patient was brought to the [place].  Identity was confirmed.  All relevant records and images related to the planned course of therapy were reviewed.  The patient freely provided informed written consent to proceed with treatment after reviewing the details related to the planned course of therapy. The consent form was witnessed and verified by the simulation staff.  Then, the patient was set-up in a stable reproducible supine position for radiation therapy.  A vacuum lock pillow device was custom fabricated to position his legs in a reproducible immobilized position.  Then, I performed a urethrogram under sterile conditions to identify the prostatic apex.  CT images were obtained.  Surface markings were placed.  The CT images were loaded into the planning software.  Then the prostate target and avoidance structures including the rectum, bladder, bowel and hips were contoured.  Treatment planning then occurred.  The radiation prescription was entered and confirmed.  A total of one complex treatment devices were fabricated. I have requested : Intensity Modulated Radiotherapy (IMRT) is medically necessary for this case for the following reason:  Rectal sparing.Marland Kitchen  PLAN:  The patient will receive 45 Gy in 25 fractions of 1.8 Gy, to supplement an up-front prostate seed implant boost of 110 Gy to achieve a total nominal dose of 165 Gy.  ________________________________  Sheral Apley Tammi Klippel, M.D.   This document serves as a record of services personally performed by Tyler Pita, MD. It was  created on his behalf by Jacqualyn Posey, a trained medical scribe. The creation of this record is based on the scribe's personal observations and the provider's statements to them. This document has been checked and approved by the attending provider.

## 2019-09-01 NOTE — Progress Notes (Signed)
  Radiation Oncology         [phone]) [phone] ________________________________  Name: Steven Bell. MRN: [id number]  Date: 08/28/2019  DOB: [birth year]  COMPLEX SIMULATION NOTE  NARRATIVE:  The patient was brought to the [facility] today following prostate seed implantation approximately one month ago.  Identity was confirmed.  All relevant records and images related to the planned course of therapy were reviewed.  Then, the patient was set-up supine.  CT images were obtained.  The CT images were loaded into the planning software.  Then the prostate and rectum were contoured.  Treatment planning then occurred.  The implanted iodine 125 seeds were identified by the physics staff for projection of radiation distribution  I have requested : 3D Simulation  I have requested a DVH of the following structures: Prostate and rectum.    ________________________________  Sheral Apley Tammi Klippel, M.D.

## 2019-09-05 DIAGNOSIS — C61 Malignant neoplasm of prostate: Secondary | ICD-10-CM | POA: Diagnosis not present

## 2019-09-10 ENCOUNTER — Ambulatory Visit
Admission: RE | Admit: 2019-09-10 | Discharge: 2019-09-10 | Disposition: A | Discharge status: Home / Self Care | Payer: PRIVATE HEALTH INSURANCE | Source: Ambulatory Visit | Attending: Radiation Oncology | Admitting: Radiation Oncology

## 2019-09-10 ENCOUNTER — Other Ambulatory Visit: Payer: Self-pay

## 2019-09-10 DIAGNOSIS — C61 Malignant neoplasm of prostate: Secondary | ICD-10-CM | POA: Diagnosis not present

## 2019-09-11 ENCOUNTER — Encounter: Payer: Self-pay | Admitting: Medical Oncology

## 2019-09-11 ENCOUNTER — Ambulatory Visit
Admission: RE | Admit: 2019-09-11 | Discharge: 2019-09-11 | Disposition: A | Discharge status: Home / Self Care | Payer: PRIVATE HEALTH INSURANCE | Source: Ambulatory Visit | Attending: Radiation Oncology | Admitting: Radiation Oncology

## 2019-09-11 ENCOUNTER — Other Ambulatory Visit: Payer: Self-pay

## 2019-09-11 DIAGNOSIS — C61 Malignant neoplasm of prostate: Secondary | ICD-10-CM | POA: Diagnosis not present

## 2019-09-12 ENCOUNTER — Other Ambulatory Visit: Payer: Self-pay

## 2019-09-12 ENCOUNTER — Ambulatory Visit
Admission: RE | Admit: 2019-09-12 | Discharge: 2019-09-12 | Disposition: A | Discharge status: Home / Self Care | Payer: PRIVATE HEALTH INSURANCE | Source: Ambulatory Visit | Attending: Radiation Oncology | Admitting: Radiation Oncology

## 2019-09-12 DIAGNOSIS — C61 Malignant neoplasm of prostate: Secondary | ICD-10-CM | POA: Diagnosis not present

## 2019-09-13 ENCOUNTER — Other Ambulatory Visit: Payer: Self-pay

## 2019-09-13 ENCOUNTER — Ambulatory Visit
Admission: RE | Admit: 2019-09-13 | Discharge: 2019-09-13 | Disposition: A | Discharge status: Home / Self Care | Payer: PRIVATE HEALTH INSURANCE | Source: Ambulatory Visit | Attending: Radiation Oncology | Admitting: Radiation Oncology

## 2019-09-13 ENCOUNTER — Encounter: Payer: Self-pay | Admitting: Radiation Oncology

## 2019-09-13 DIAGNOSIS — C61 Malignant neoplasm of prostate: Secondary | ICD-10-CM | POA: Diagnosis not present

## 2019-09-14 ENCOUNTER — Ambulatory Visit
Admission: RE | Admit: 2019-09-14 | Discharge: 2019-09-14 | Disposition: A | Discharge status: Home / Self Care | Payer: PRIVATE HEALTH INSURANCE | Source: Ambulatory Visit | Attending: Radiation Oncology | Admitting: Radiation Oncology

## 2019-09-14 ENCOUNTER — Other Ambulatory Visit: Payer: Self-pay

## 2019-09-14 DIAGNOSIS — C61 Malignant neoplasm of prostate: Secondary | ICD-10-CM | POA: Diagnosis not present

## 2019-09-14 NOTE — Progress Notes (Signed)
  Radiation Oncology         [phone] ________________________________  Name: Steven Bell. MRN: [id number]  Date: 09/13/2019  DOB: Feb 07, 1946  3D Planning Note   Prostate Brachytherapy Post-Implant Dosimetry  Diagnosis: 74 y.o. gentleman with Stage T1c adenocarcinoma of the prostate with Gleason score of 4+5, and PSA of 15.3.  Narrative: On a previous date, Steven Bell. returned following prostate seed implantation for post implant planning. He underwent CT scan complex simulation to delineate the three-dimensional structures of the pelvis and demonstrate the radiation distribution.  Since that time, the seed localization, and complex isodose planning with dose volume histograms have now been completed.  Results:   Prostate Coverage - The dose of radiation delivered to the 90% or more of the prostate gland (D90) was 94.43% of the prescription dose. This exceeds our goal of greater than 90%. Rectal Sparing - The volume of rectal tissue receiving the prescription dose or higher was 0.24 cc. This falls under our thresholds tolerance of 1.0 cc.  Impression: The prostate seed implant appears to show adequate target coverage and appropriate rectal sparing.  Plan:  The patient will continue to follow with urology for ongoing PSA determinations. I would anticipate a high likelihood for local tumor control with minimal risk for rectal morbidity.  ________________________________  Sheral Apley Tammi Klippel, M.D.

## 2019-09-17 ENCOUNTER — Ambulatory Visit
Admission: RE | Admit: 2019-09-17 | Discharge: 2019-09-17 | Disposition: A | Discharge status: Home / Self Care | Payer: PRIVATE HEALTH INSURANCE | Source: Ambulatory Visit | Attending: Radiation Oncology | Admitting: Radiation Oncology

## 2019-09-17 ENCOUNTER — Other Ambulatory Visit: Payer: Self-pay

## 2019-09-17 DIAGNOSIS — C61 Malignant neoplasm of prostate: Secondary | ICD-10-CM | POA: Insufficient documentation

## 2019-09-18 ENCOUNTER — Other Ambulatory Visit: Payer: Self-pay

## 2019-09-18 ENCOUNTER — Ambulatory Visit
Admission: RE | Admit: 2019-09-18 | Discharge: 2019-09-18 | Disposition: A | Discharge status: Home / Self Care | Payer: PRIVATE HEALTH INSURANCE | Source: Ambulatory Visit | Attending: Radiation Oncology | Admitting: Radiation Oncology

## 2019-09-18 DIAGNOSIS — C61 Malignant neoplasm of prostate: Secondary | ICD-10-CM | POA: Diagnosis not present

## 2019-09-19 ENCOUNTER — Other Ambulatory Visit: Payer: Self-pay

## 2019-09-19 ENCOUNTER — Ambulatory Visit
Admission: RE | Admit: 2019-09-19 | Discharge: 2019-09-19 | Disposition: A | Discharge status: Home / Self Care | Payer: PRIVATE HEALTH INSURANCE | Source: Ambulatory Visit | Attending: Radiation Oncology | Admitting: Radiation Oncology

## 2019-09-19 DIAGNOSIS — C61 Malignant neoplasm of prostate: Secondary | ICD-10-CM | POA: Diagnosis not present

## 2019-09-20 ENCOUNTER — Other Ambulatory Visit: Payer: Self-pay

## 2019-09-20 ENCOUNTER — Ambulatory Visit
Admission: RE | Admit: 2019-09-20 | Discharge: 2019-09-20 | Disposition: A | Discharge status: Home / Self Care | Payer: PRIVATE HEALTH INSURANCE | Source: Ambulatory Visit | Attending: Radiation Oncology | Admitting: Radiation Oncology

## 2019-09-20 DIAGNOSIS — C61 Malignant neoplasm of prostate: Secondary | ICD-10-CM | POA: Diagnosis not present

## 2019-09-21 ENCOUNTER — Other Ambulatory Visit: Payer: Self-pay

## 2019-09-21 ENCOUNTER — Ambulatory Visit
Admission: RE | Admit: 2019-09-21 | Discharge: 2019-09-21 | Disposition: A | Discharge status: Home / Self Care | Payer: PRIVATE HEALTH INSURANCE | Source: Ambulatory Visit | Attending: Radiation Oncology | Admitting: Radiation Oncology

## 2019-09-21 DIAGNOSIS — C61 Malignant neoplasm of prostate: Secondary | ICD-10-CM | POA: Diagnosis not present

## 2019-09-24 ENCOUNTER — Other Ambulatory Visit: Payer: Self-pay

## 2019-09-24 ENCOUNTER — Ambulatory Visit
Admission: RE | Admit: 2019-09-24 | Discharge: 2019-09-24 | Disposition: A | Discharge status: Home / Self Care | Payer: PRIVATE HEALTH INSURANCE | Source: Ambulatory Visit | Attending: Radiation Oncology | Admitting: Radiation Oncology

## 2019-09-24 DIAGNOSIS — C61 Malignant neoplasm of prostate: Secondary | ICD-10-CM | POA: Diagnosis not present

## 2019-09-25 ENCOUNTER — Ambulatory Visit
Admission: RE | Admit: 2019-09-25 | Discharge: 2019-09-25 | Disposition: A | Discharge status: Home / Self Care | Payer: PRIVATE HEALTH INSURANCE | Source: Ambulatory Visit | Attending: Radiation Oncology | Admitting: Radiation Oncology

## 2019-09-25 ENCOUNTER — Other Ambulatory Visit: Payer: Self-pay

## 2019-09-25 DIAGNOSIS — C61 Malignant neoplasm of prostate: Secondary | ICD-10-CM | POA: Diagnosis not present

## 2019-09-26 ENCOUNTER — Ambulatory Visit
Admission: RE | Admit: 2019-09-26 | Discharge: 2019-09-26 | Disposition: A | Discharge status: Home / Self Care | Payer: PRIVATE HEALTH INSURANCE | Source: Ambulatory Visit | Attending: Radiation Oncology | Admitting: Radiation Oncology

## 2019-09-26 ENCOUNTER — Other Ambulatory Visit: Payer: Self-pay

## 2019-09-26 DIAGNOSIS — C61 Malignant neoplasm of prostate: Secondary | ICD-10-CM | POA: Diagnosis not present

## 2019-09-27 ENCOUNTER — Ambulatory Visit
Admission: RE | Admit: 2019-09-27 | Discharge: 2019-09-27 | Disposition: A | Discharge status: Home / Self Care | Payer: PRIVATE HEALTH INSURANCE | Source: Ambulatory Visit | Attending: Radiation Oncology | Admitting: Radiation Oncology

## 2019-09-27 ENCOUNTER — Other Ambulatory Visit: Payer: Self-pay

## 2019-09-27 DIAGNOSIS — C61 Malignant neoplasm of prostate: Secondary | ICD-10-CM | POA: Diagnosis not present

## 2019-09-28 ENCOUNTER — Ambulatory Visit
Admission: RE | Admit: 2019-09-28 | Discharge: 2019-09-28 | Disposition: A | Discharge status: Home / Self Care | Payer: PRIVATE HEALTH INSURANCE | Source: Ambulatory Visit | Attending: Radiation Oncology | Admitting: Radiation Oncology

## 2019-09-28 ENCOUNTER — Other Ambulatory Visit: Payer: Self-pay

## 2019-09-28 DIAGNOSIS — C61 Malignant neoplasm of prostate: Secondary | ICD-10-CM | POA: Diagnosis not present

## 2019-10-01 ENCOUNTER — Other Ambulatory Visit: Payer: Self-pay

## 2019-10-01 ENCOUNTER — Ambulatory Visit
Admission: RE | Admit: 2019-10-01 | Discharge: 2019-10-01 | Disposition: A | Discharge status: Home / Self Care | Payer: PRIVATE HEALTH INSURANCE | Source: Ambulatory Visit | Attending: Radiation Oncology | Admitting: Radiation Oncology

## 2019-10-01 DIAGNOSIS — C61 Malignant neoplasm of prostate: Secondary | ICD-10-CM | POA: Diagnosis not present

## 2019-10-02 ENCOUNTER — Ambulatory Visit
Admission: RE | Admit: 2019-10-02 | Discharge: 2019-10-02 | Disposition: A | Discharge status: Home / Self Care | Payer: PRIVATE HEALTH INSURANCE | Source: Ambulatory Visit | Attending: Radiation Oncology | Admitting: Radiation Oncology

## 2019-10-02 ENCOUNTER — Other Ambulatory Visit: Payer: Self-pay

## 2019-10-02 DIAGNOSIS — C61 Malignant neoplasm of prostate: Secondary | ICD-10-CM | POA: Diagnosis not present

## 2019-10-03 ENCOUNTER — Ambulatory Visit
Admission: RE | Admit: 2019-10-03 | Discharge: 2019-10-03 | Disposition: A | Discharge status: Home / Self Care | Payer: PRIVATE HEALTH INSURANCE | Source: Ambulatory Visit | Attending: Radiation Oncology | Admitting: Radiation Oncology

## 2019-10-03 ENCOUNTER — Other Ambulatory Visit: Payer: Self-pay

## 2019-10-03 DIAGNOSIS — C61 Malignant neoplasm of prostate: Secondary | ICD-10-CM | POA: Diagnosis not present

## 2019-10-04 ENCOUNTER — Ambulatory Visit
Admission: RE | Admit: 2019-10-04 | Discharge: 2019-10-04 | Disposition: A | Discharge status: Home / Self Care | Payer: PRIVATE HEALTH INSURANCE | Source: Ambulatory Visit | Attending: Radiation Oncology | Admitting: Radiation Oncology

## 2019-10-04 ENCOUNTER — Other Ambulatory Visit: Payer: Self-pay

## 2019-10-04 DIAGNOSIS — C61 Malignant neoplasm of prostate: Secondary | ICD-10-CM | POA: Diagnosis not present

## 2019-10-05 ENCOUNTER — Ambulatory Visit
Admission: RE | Admit: 2019-10-05 | Discharge: 2019-10-05 | Disposition: A | Discharge status: Home / Self Care | Payer: PRIVATE HEALTH INSURANCE | Source: Ambulatory Visit | Attending: Radiation Oncology | Admitting: Radiation Oncology

## 2019-10-05 ENCOUNTER — Other Ambulatory Visit: Payer: Self-pay

## 2019-10-05 DIAGNOSIS — C61 Malignant neoplasm of prostate: Secondary | ICD-10-CM | POA: Diagnosis not present

## 2019-10-08 ENCOUNTER — Other Ambulatory Visit: Payer: Self-pay

## 2019-10-08 ENCOUNTER — Ambulatory Visit
Admission: RE | Admit: 2019-10-08 | Discharge: 2019-10-08 | Disposition: A | Discharge status: Home / Self Care | Payer: PRIVATE HEALTH INSURANCE | Source: Ambulatory Visit | Attending: Radiation Oncology | Admitting: Radiation Oncology

## 2019-10-08 DIAGNOSIS — C61 Malignant neoplasm of prostate: Secondary | ICD-10-CM | POA: Diagnosis not present

## 2019-10-09 ENCOUNTER — Other Ambulatory Visit: Payer: Self-pay

## 2019-10-09 ENCOUNTER — Ambulatory Visit
Admission: RE | Admit: 2019-10-09 | Discharge: 2019-10-09 | Disposition: A | Discharge status: Home / Self Care | Payer: PRIVATE HEALTH INSURANCE | Source: Ambulatory Visit | Attending: Radiation Oncology | Admitting: Radiation Oncology

## 2019-10-09 DIAGNOSIS — C61 Malignant neoplasm of prostate: Secondary | ICD-10-CM | POA: Diagnosis not present

## 2019-10-10 ENCOUNTER — Other Ambulatory Visit: Payer: Self-pay

## 2019-10-10 ENCOUNTER — Ambulatory Visit
Admission: RE | Admit: 2019-10-10 | Discharge: 2019-10-10 | Disposition: A | Discharge status: Home / Self Care | Payer: PRIVATE HEALTH INSURANCE | Source: Ambulatory Visit | Attending: Radiation Oncology | Admitting: Radiation Oncology

## 2019-10-10 DIAGNOSIS — C61 Malignant neoplasm of prostate: Secondary | ICD-10-CM | POA: Diagnosis not present

## 2019-10-11 ENCOUNTER — Ambulatory Visit
Admission: RE | Admit: 2019-10-11 | Discharge: 2019-10-11 | Disposition: A | Discharge status: Home / Self Care | Payer: PRIVATE HEALTH INSURANCE | Source: Ambulatory Visit | Attending: Radiation Oncology | Admitting: Radiation Oncology

## 2019-10-11 ENCOUNTER — Other Ambulatory Visit: Payer: Self-pay

## 2019-10-11 DIAGNOSIS — C61 Malignant neoplasm of prostate: Secondary | ICD-10-CM | POA: Diagnosis not present

## 2019-10-12 ENCOUNTER — Encounter: Payer: Self-pay | Admitting: Medical Oncology

## 2019-10-12 ENCOUNTER — Encounter: Payer: Self-pay | Admitting: Urology

## 2019-10-12 ENCOUNTER — Ambulatory Visit
Admission: RE | Admit: 2019-10-12 | Discharge: 2019-10-12 | Disposition: A | Discharge status: Home / Self Care | Payer: PRIVATE HEALTH INSURANCE | Source: Ambulatory Visit | Attending: Radiation Oncology | Admitting: Radiation Oncology

## 2019-10-12 ENCOUNTER — Other Ambulatory Visit: Payer: Self-pay

## 2019-10-12 DIAGNOSIS — C61 Malignant neoplasm of prostate: Secondary | ICD-10-CM | POA: Diagnosis not present

## 2019-11-13 ENCOUNTER — Other Ambulatory Visit: Payer: Self-pay

## 2019-11-13 ENCOUNTER — Encounter: Payer: Self-pay | Admitting: Urology

## 2019-11-13 NOTE — Progress Notes (Signed)
Radiation Oncology         [phone]) [phone] ________________________________  Name: Steven Bell. MRN: [id number]  Date: 11/14/2019  DOB: Aug 06, 1945  Post Treatment Note  CC: Kathyrn Lass, MD  Franchot Gallo, MD  Diagnosis:   74 y.o. gentleman with Stage T1c adenocarcinoma of the prostate with Gleason score of 4+5, and PSA of 15.3    Interval Since Last Radiation:  4.5 weeks  08/01/19: Insertion of radioactive I-125 seeds into the prostate gland.;110Gy, boost therapy.  09/10/19 - 10/12/19: The prostate and pelvic nodes were treated to 45 Gy in 25 fractions of 1.8 Gy, to supplement an up-front prostate seed implant boost of 110 Gy to achieve a total nominal dose of 155 Gy.   Narrative:  I spoke with the patient to conduct his routine scheduled 1 month follow up visit via telephone to spare the patient unnecessary potential exposure in the healthcare setting during the current COVID-19 pandemic.  The patient was notified in advance and gave permission to proceed with this visit format.  He tolerated radiation treatment relatively well with only minor urinary irritation and modest fatigue.  He continued to perform CIC 4-5x/day throughout treatment and did experience some diarrhea which was managed with Imodium prn.                              On review of systems, the patient states that he is doing well in general with gradual improvement in the LUTS.  He continues to self catheterize 4-5 times per day but denies gross hematuria, dysuria, straining to void or incontinence.  He did have some occasional bouts of diarrhea last week but this has since resolved.  Really, his only complaint at this point is significant fatigue/decreased stamina.  We discussed that this is likely a combination of factors from the radiation fatigue as well as his ADT.  He had a recent 76-month Eligard injection on 10/05/2019 and is due for his next injection in January 2022.  He denies any significant hot flashes and  overall, is quite pleased with his progress to date.  ALLERGIES:  has No Known Allergies.  Meds: Current Outpatient Medications  Medication Sig Dispense Refill  . allopurinol (ZYLOPRIM) 100 MG tablet Take 100 mg by mouth daily.     Marland Kitchen atorvastatin (LIPITOR) 10 MG tablet Take 10 mg by mouth daily.     . hydrochlorothiazide (HYDRODIURIL) 25 MG tablet Take 25 mg by mouth daily.    Marland Kitchen lisinopril (ZESTRIL) 20 MG tablet Take 20 mg by mouth daily.     . metoprolol succinate (TOPROL-XL) 100 MG 24 hr tablet Take 100 mg by mouth daily. Take with or immediately following a meal.      No current facility-administered medications for this encounter.    Physical Findings:  vitals were not taken for this visit.   /Unable to assess due to telephone follow up visit format.   Lab Findings: Lab Results  Component Value Date   WBC 6.6 07/23/2019   HGB 11.4 (L) 07/23/2019   HCT 35.8 (L) 07/23/2019   MCV 95.5 07/23/2019   PLT 205 07/23/2019     Radiographic Findings: No results found.  Impression/Plan: 1. 74 y.o. gentleman with Stage T1c adenocarcinoma of the prostate with Gleason score of 4+5, and PSA of 15.3. He will continue to follow up with urology for ongoing PSA determinations and had an appointment with Dr. Diona Fanti on 10/05/2019.  He  had labs drawn prior to that visit and his PSA and testosterone were both very low, indicating a good response to therapy.  He was given a 47-month Eligard injection at that time with plans for repeat labs and injection in January 2022.  He understands what to expect with regards to PSA monitoring going forward. I will look forward to following his response to treatment via correspondence with urology, and would be happy to continue to participate in his care if clinically indicated. I talked to the patient about what to expect in the future, including his risk for erectile dysfunction and rectal bleeding. I encouraged him to call or return to the office if he has  any questions regarding his previous radiation or possible radiation side effects. He was comfortable with this plan and will follow up as needed.    Nicholos Johns, PA-C

## 2019-11-13 NOTE — Progress Notes (Signed)
  Radiation Oncology         [phone]) [phone] ________________________________  Name: Rudean Curt. MRN: [id number]  Date: 10/12/2019  DOB: Oct 18, 1945  End of Treatment Note  Diagnosis:   74 y.o. gentleman with Stage T1c adenocarcinoma of the prostate with Gleason score of 4+5, and PSA of 15.3     Indication for treatment:  Curative, Definitive Radiotherapy concurrent with LT-ADT (started      Radiation treatment dates:   08/01/19; 09/10/19 - 10/12/19  Site/dose:    08/01/19: Insertion of radioactive I-125 seeds into the prostate gland.;110 Gy, boost therapy.  09/10/19 - 10/12/19: The prostate and pelvic nodes were treated to 45 Gy in 25 fractions of 1.8 Gy, to supplement an up-front prostate seed implant boost of 110 Gy to achieve a total nominal dose of 155 Gy.  Beams/energy:   The patient was treated with IMRT using volumetric arc therapy delivering 6 MV X-rays to clockwise and counterclockwise circumferential arcs with a 90 degree collimator offset to avoid dose scalloping.  Image guidance was performed with daily cone beam CT prior to each fraction to align to gold markers in the prostate and assure proper bladder and rectal fill volumes.  Immobilization was achieved with [employer] custom mold.  Narrative: The patient tolerated radiation treatment relatively well with only minor urinary irritation and modest fatigue.  He continued to perform CIC 4-5x/day throughout treatment and did experience some diarrhea which was managed with Imodium prn.  Plan: The patient has completed radiation treatment. He will return to radiation oncology clinic for routine followup in one month. I advised him to call or return sooner if he has any questions or concerns related to his recovery or treatment. ________________________________  Sheral Apley. Tammi Klippel, M.D.

## 2019-11-14 ENCOUNTER — Ambulatory Visit
Admission: RE | Admit: 2019-11-14 | Discharge: 2019-11-14 | Disposition: A | Discharge status: Home / Self Care | Payer: PRIVATE HEALTH INSURANCE | Source: Ambulatory Visit | Attending: Urology | Admitting: Urology

## 2019-11-14 DIAGNOSIS — C61 Malignant neoplasm of prostate: Secondary | ICD-10-CM

## 2019-12-15 ENCOUNTER — Ambulatory Visit: Payer: PRIVATE HEALTH INSURANCE | Attending: Internal Medicine

## 2019-12-15 DIAGNOSIS — Z23 Encounter for immunization: Secondary | ICD-10-CM

## 2019-12-15 NOTE — Progress Notes (Signed)
° °  [id number]  Name:  Steven Bell.    MRN: [id number] DOB: 1945-05-07  12/15/2019  Steven Bell was observed post Covid-19 immunization for 15 minutes without incident. He was provided with Vaccine Information Sheet and instruction to access the V-Safe system.   Steven Bell was instructed to call 911 with any severe reactions post vaccine:  Difficulty breathing   Swelling of face and throat   A fast heartbeat   A bad rash all over body   Dizziness and weakness

## 2020-02-26 DIAGNOSIS — B029 Zoster without complications: Secondary | ICD-10-CM | POA: Diagnosis not present

## 2020-02-29 ENCOUNTER — Other Ambulatory Visit: Payer: PRIVATE HEALTH INSURANCE

## 2020-03-05 DIAGNOSIS — C61 Malignant neoplasm of prostate: Secondary | ICD-10-CM | POA: Diagnosis not present

## 2020-03-05 DIAGNOSIS — R339 Retention of urine, unspecified: Secondary | ICD-10-CM | POA: Diagnosis not present

## 2020-06-23 DIAGNOSIS — M109 Gout, unspecified: Secondary | ICD-10-CM | POA: Diagnosis not present

## 2020-06-30 DIAGNOSIS — M1A00X0 Idiopathic chronic gout, unspecified site, without tophus (tophi): Secondary | ICD-10-CM | POA: Diagnosis not present

## 2020-06-30 DIAGNOSIS — N183 Chronic kidney disease, stage 3 unspecified: Secondary | ICD-10-CM | POA: Diagnosis not present

## 2020-06-30 DIAGNOSIS — M109 Gout, unspecified: Secondary | ICD-10-CM | POA: Diagnosis not present

## 2020-07-09 DIAGNOSIS — C61 Malignant neoplasm of prostate: Secondary | ICD-10-CM | POA: Diagnosis not present

## 2020-07-16 DIAGNOSIS — Z5111 Encounter for antineoplastic chemotherapy: Secondary | ICD-10-CM | POA: Diagnosis not present

## 2020-07-16 DIAGNOSIS — C61 Malignant neoplasm of prostate: Secondary | ICD-10-CM | POA: Diagnosis not present

## 2020-07-16 DIAGNOSIS — Z79899 Other long term (current) drug therapy: Secondary | ICD-10-CM | POA: Diagnosis not present

## 2020-08-28 DIAGNOSIS — Z8546 Personal history of malignant neoplasm of prostate: Secondary | ICD-10-CM | POA: Diagnosis not present

## 2020-08-28 DIAGNOSIS — G4700 Insomnia, unspecified: Secondary | ICD-10-CM | POA: Diagnosis not present

## 2020-08-28 DIAGNOSIS — Z8249 Family history of ischemic heart disease and other diseases of the circulatory system: Secondary | ICD-10-CM | POA: Diagnosis not present

## 2020-08-28 DIAGNOSIS — M109 Gout, unspecified: Secondary | ICD-10-CM | POA: Diagnosis not present

## 2020-08-28 DIAGNOSIS — I10 Essential (primary) hypertension: Secondary | ICD-10-CM | POA: Diagnosis not present

## 2020-08-28 DIAGNOSIS — Z823 Family history of stroke: Secondary | ICD-10-CM | POA: Diagnosis not present

## 2020-08-28 DIAGNOSIS — Z7722 Contact with and (suspected) exposure to environmental tobacco smoke (acute) (chronic): Secondary | ICD-10-CM | POA: Diagnosis not present

## 2020-09-08 DIAGNOSIS — Z6825 Body mass index (BMI) 25.0-25.9, adult: Secondary | ICD-10-CM | POA: Diagnosis not present

## 2020-09-08 DIAGNOSIS — N183 Chronic kidney disease, stage 3 unspecified: Secondary | ICD-10-CM | POA: Diagnosis not present

## 2020-09-08 DIAGNOSIS — M109 Gout, unspecified: Secondary | ICD-10-CM | POA: Diagnosis not present

## 2020-09-08 DIAGNOSIS — I129 Hypertensive chronic kidney disease with stage 1 through stage 4 chronic kidney disease, or unspecified chronic kidney disease: Secondary | ICD-10-CM | POA: Diagnosis not present

## 2020-09-08 DIAGNOSIS — I10 Essential (primary) hypertension: Secondary | ICD-10-CM | POA: Diagnosis not present

## 2020-09-08 DIAGNOSIS — M1A00X0 Idiopathic chronic gout, unspecified site, without tophus (tophi): Secondary | ICD-10-CM | POA: Diagnosis not present

## 2020-09-08 DIAGNOSIS — E785 Hyperlipidemia, unspecified: Secondary | ICD-10-CM | POA: Diagnosis not present

## 2020-11-14 DIAGNOSIS — C61 Malignant neoplasm of prostate: Secondary | ICD-10-CM | POA: Diagnosis not present

## 2021-03-09 DIAGNOSIS — I10 Essential (primary) hypertension: Secondary | ICD-10-CM | POA: Diagnosis not present

## 2021-03-09 DIAGNOSIS — N183 Chronic kidney disease, stage 3 unspecified: Secondary | ICD-10-CM | POA: Diagnosis not present

## 2021-03-09 DIAGNOSIS — I129 Hypertensive chronic kidney disease with stage 1 through stage 4 chronic kidney disease, or unspecified chronic kidney disease: Secondary | ICD-10-CM | POA: Diagnosis not present

## 2021-03-09 DIAGNOSIS — D6489 Other specified anemias: Secondary | ICD-10-CM | POA: Diagnosis not present

## 2021-03-09 DIAGNOSIS — M1A00X0 Idiopathic chronic gout, unspecified site, without tophus (tophi): Secondary | ICD-10-CM | POA: Diagnosis not present

## 2021-03-09 DIAGNOSIS — E785 Hyperlipidemia, unspecified: Secondary | ICD-10-CM | POA: Diagnosis not present

## 2021-03-09 DIAGNOSIS — Z6823 Body mass index (BMI) 23.0-23.9, adult: Secondary | ICD-10-CM | POA: Diagnosis not present

## 2021-03-09 DIAGNOSIS — C61 Malignant neoplasm of prostate: Secondary | ICD-10-CM | POA: Diagnosis not present

## 2021-09-17 DIAGNOSIS — R7301 Impaired fasting glucose: Secondary | ICD-10-CM | POA: Diagnosis not present

## 2021-09-17 DIAGNOSIS — Z862 Personal history of diseases of the blood and blood-forming organs and certain disorders involving the immune mechanism: Secondary | ICD-10-CM | POA: Diagnosis not present

## 2021-09-17 DIAGNOSIS — E785 Hyperlipidemia, unspecified: Secondary | ICD-10-CM | POA: Diagnosis not present

## 2021-09-17 DIAGNOSIS — I129 Hypertensive chronic kidney disease with stage 1 through stage 4 chronic kidney disease, or unspecified chronic kidney disease: Secondary | ICD-10-CM | POA: Diagnosis not present

## 2021-09-17 DIAGNOSIS — Z79899 Other long term (current) drug therapy: Secondary | ICD-10-CM | POA: Diagnosis not present

## 2021-09-17 DIAGNOSIS — D6489 Other specified anemias: Secondary | ICD-10-CM | POA: Diagnosis not present

## 2021-09-17 DIAGNOSIS — N1832 Chronic kidney disease, stage 3b: Secondary | ICD-10-CM | POA: Diagnosis not present

## 2021-11-25 DIAGNOSIS — C61 Malignant neoplasm of prostate: Secondary | ICD-10-CM | POA: Diagnosis not present

## 2021-11-30 DIAGNOSIS — N35919 Unspecified urethral stricture, male, unspecified site: Secondary | ICD-10-CM | POA: Diagnosis not present

## 2021-11-30 DIAGNOSIS — R339 Retention of urine, unspecified: Secondary | ICD-10-CM | POA: Diagnosis not present

## 2022-01-05 DIAGNOSIS — I10 Essential (primary) hypertension: Secondary | ICD-10-CM | POA: Diagnosis not present

## 2022-01-05 DIAGNOSIS — Z8249 Family history of ischemic heart disease and other diseases of the circulatory system: Secondary | ICD-10-CM | POA: Diagnosis not present

## 2022-01-05 DIAGNOSIS — Z008 Encounter for other general examination: Secondary | ICD-10-CM | POA: Diagnosis not present

## 2022-01-05 DIAGNOSIS — Z823 Family history of stroke: Secondary | ICD-10-CM | POA: Diagnosis not present

## 2022-01-05 DIAGNOSIS — E785 Hyperlipidemia, unspecified: Secondary | ICD-10-CM | POA: Diagnosis not present

## 2022-01-05 DIAGNOSIS — Z833 Family history of diabetes mellitus: Secondary | ICD-10-CM | POA: Diagnosis not present

## 2022-01-05 DIAGNOSIS — M109 Gout, unspecified: Secondary | ICD-10-CM | POA: Diagnosis not present

## 2022-01-05 DIAGNOSIS — N529 Male erectile dysfunction, unspecified: Secondary | ICD-10-CM | POA: Diagnosis not present

## 2022-02-17 DIAGNOSIS — N35919 Unspecified urethral stricture, male, unspecified site: Secondary | ICD-10-CM | POA: Diagnosis not present

## 2022-02-17 DIAGNOSIS — R339 Retention of urine, unspecified: Secondary | ICD-10-CM | POA: Diagnosis not present

## 2022-03-30 DIAGNOSIS — Z0000 Encounter for general adult medical examination without abnormal findings: Secondary | ICD-10-CM | POA: Diagnosis not present

## 2022-03-30 DIAGNOSIS — N1832 Chronic kidney disease, stage 3b: Secondary | ICD-10-CM | POA: Diagnosis not present

## 2022-03-30 DIAGNOSIS — Z79899 Other long term (current) drug therapy: Secondary | ICD-10-CM | POA: Diagnosis not present

## 2022-03-30 DIAGNOSIS — I10 Essential (primary) hypertension: Secondary | ICD-10-CM | POA: Diagnosis not present

## 2022-03-30 DIAGNOSIS — Z8546 Personal history of malignant neoplasm of prostate: Secondary | ICD-10-CM | POA: Diagnosis not present

## 2022-06-17 DIAGNOSIS — R339 Retention of urine, unspecified: Secondary | ICD-10-CM | POA: Diagnosis not present

## 2022-06-17 DIAGNOSIS — N35919 Unspecified urethral stricture, male, unspecified site: Secondary | ICD-10-CM | POA: Diagnosis not present

## 2022-09-28 DIAGNOSIS — I10 Essential (primary) hypertension: Secondary | ICD-10-CM | POA: Diagnosis not present

## 2022-09-28 DIAGNOSIS — E785 Hyperlipidemia, unspecified: Secondary | ICD-10-CM | POA: Diagnosis not present

## 2022-09-28 DIAGNOSIS — D6489 Other specified anemias: Secondary | ICD-10-CM | POA: Diagnosis not present

## 2022-09-28 DIAGNOSIS — N1832 Chronic kidney disease, stage 3b: Secondary | ICD-10-CM | POA: Diagnosis not present

## 2022-09-28 DIAGNOSIS — M1A00X0 Idiopathic chronic gout, unspecified site, without tophus (tophi): Secondary | ICD-10-CM | POA: Diagnosis not present

## 2022-09-28 DIAGNOSIS — R7301 Impaired fasting glucose: Secondary | ICD-10-CM | POA: Diagnosis not present

## 2022-09-28 DIAGNOSIS — Z6824 Body mass index (BMI) 24.0-24.9, adult: Secondary | ICD-10-CM | POA: Diagnosis not present

## 2022-10-28 DIAGNOSIS — N35919 Unspecified urethral stricture, male, unspecified site: Secondary | ICD-10-CM | POA: Diagnosis not present

## 2022-10-28 DIAGNOSIS — R339 Retention of urine, unspecified: Secondary | ICD-10-CM | POA: Diagnosis not present
